# Patient Record
Sex: Male | Born: 1955 | Race: White | Hispanic: No | Marital: Married | Smoking: Former smoker
Health system: Southern US, Community
[De-identification: ages and names within clinical notes are randomized; demographics above are authoritative.]

## PROBLEM LIST (undated history)

## (undated) DIAGNOSIS — M549 Dorsalgia, unspecified: Secondary | ICD-10-CM

## (undated) DIAGNOSIS — E785 Hyperlipidemia, unspecified: Secondary | ICD-10-CM

## (undated) DIAGNOSIS — I255 Ischemic cardiomyopathy: Secondary | ICD-10-CM

## (undated) DIAGNOSIS — I10 Essential (primary) hypertension: Secondary | ICD-10-CM

## (undated) DIAGNOSIS — I2510 Atherosclerotic heart disease of native coronary artery without angina pectoris: Secondary | ICD-10-CM

## (undated) DIAGNOSIS — E669 Obesity, unspecified: Secondary | ICD-10-CM

## (undated) DIAGNOSIS — R55 Syncope and collapse: Secondary | ICD-10-CM

## (undated) DIAGNOSIS — K635 Polyp of colon: Secondary | ICD-10-CM

## (undated) DIAGNOSIS — K219 Gastro-esophageal reflux disease without esophagitis: Secondary | ICD-10-CM

## (undated) HISTORY — DX: Dorsalgia, unspecified: M54.9

## (undated) HISTORY — DX: Hyperlipidemia, unspecified: E78.5

## (undated) HISTORY — DX: Polyp of colon: K63.5

## (undated) HISTORY — DX: Ischemic cardiomyopathy: I25.5

## (undated) HISTORY — DX: Syncope and collapse: R55

## (undated) HISTORY — DX: Essential (primary) hypertension: I10

## (undated) HISTORY — DX: Gastro-esophageal reflux disease without esophagitis: K21.9

## (undated) HISTORY — DX: Obesity, unspecified: E66.9

## (undated) HISTORY — PX: CHOLECYSTECTOMY: SHX55

## (undated) HISTORY — DX: Atherosclerotic heart disease of native coronary artery without angina pectoris: I25.10

---

## 1997-11-01 ENCOUNTER — Observation Stay (HOSPITAL_COMMUNITY): Admission: RE | Admit: 1997-11-01 | Discharge: 1997-11-02 | Payer: Self-pay | Admitting: General Surgery

## 1998-04-05 ENCOUNTER — Encounter: Payer: Self-pay | Admitting: Emergency Medicine

## 1998-04-05 ENCOUNTER — Observation Stay (HOSPITAL_COMMUNITY): Admission: EM | Admit: 1998-04-05 | Discharge: 1998-04-06 | Payer: Self-pay | Admitting: Emergency Medicine

## 1998-04-13 ENCOUNTER — Emergency Department (HOSPITAL_COMMUNITY): Admission: EM | Admit: 1998-04-13 | Discharge: 1998-04-13 | Payer: Self-pay | Admitting: Emergency Medicine

## 1998-04-13 ENCOUNTER — Encounter: Payer: Self-pay | Admitting: Emergency Medicine

## 1998-07-11 ENCOUNTER — Ambulatory Visit (HOSPITAL_COMMUNITY): Admission: RE | Admit: 1998-07-11 | Discharge: 1998-07-11 | Payer: Self-pay | Admitting: Specialist

## 1998-07-11 ENCOUNTER — Encounter: Payer: Self-pay | Admitting: Specialist

## 1998-11-12 ENCOUNTER — Ambulatory Visit (HOSPITAL_COMMUNITY): Admission: RE | Admit: 1998-11-12 | Discharge: 1998-11-12 | Payer: Self-pay | Admitting: Specialist

## 1998-11-12 ENCOUNTER — Encounter: Payer: Self-pay | Admitting: Specialist

## 1999-03-03 DIAGNOSIS — I2510 Atherosclerotic heart disease of native coronary artery without angina pectoris: Secondary | ICD-10-CM

## 1999-03-03 HISTORY — PX: CORONARY ANGIOPLASTY WITH STENT PLACEMENT: SHX49

## 1999-03-03 HISTORY — DX: Atherosclerotic heart disease of native coronary artery without angina pectoris: I25.10

## 1999-10-04 ENCOUNTER — Encounter: Payer: Self-pay | Admitting: *Deleted

## 1999-10-04 ENCOUNTER — Observation Stay (HOSPITAL_COMMUNITY): Admission: EM | Admit: 1999-10-04 | Discharge: 1999-10-05 | Payer: Self-pay | Admitting: *Deleted

## 1999-10-29 ENCOUNTER — Ambulatory Visit (HOSPITAL_COMMUNITY): Admission: RE | Admit: 1999-10-29 | Discharge: 1999-10-30 | Payer: Self-pay | Admitting: *Deleted

## 2000-06-23 ENCOUNTER — Encounter: Payer: Self-pay | Admitting: Emergency Medicine

## 2000-06-24 ENCOUNTER — Observation Stay (HOSPITAL_COMMUNITY): Admission: EM | Admit: 2000-06-24 | Discharge: 2000-06-25 | Payer: Self-pay | Admitting: Emergency Medicine

## 2000-08-05 ENCOUNTER — Encounter: Admission: RE | Admit: 2000-08-05 | Discharge: 2000-08-05 | Payer: Self-pay | Admitting: Neurosurgery

## 2000-08-05 ENCOUNTER — Encounter: Payer: Self-pay | Admitting: Neurosurgery

## 2000-08-14 ENCOUNTER — Ambulatory Visit (HOSPITAL_COMMUNITY): Admission: RE | Admit: 2000-08-14 | Discharge: 2000-08-14 | Payer: Self-pay | Admitting: Neurosurgery

## 2000-08-14 ENCOUNTER — Encounter: Payer: Self-pay | Admitting: Neurosurgery

## 2000-11-15 ENCOUNTER — Emergency Department (HOSPITAL_COMMUNITY): Admission: EM | Admit: 2000-11-15 | Discharge: 2000-11-16 | Payer: Self-pay | Admitting: Emergency Medicine

## 2001-01-14 ENCOUNTER — Encounter: Payer: Self-pay | Admitting: Emergency Medicine

## 2001-01-14 ENCOUNTER — Inpatient Hospital Stay (HOSPITAL_COMMUNITY): Admission: EM | Admit: 2001-01-14 | Discharge: 2001-01-17 | Payer: Self-pay | Admitting: Emergency Medicine

## 2002-08-10 ENCOUNTER — Encounter: Payer: Self-pay | Admitting: Emergency Medicine

## 2002-08-11 ENCOUNTER — Observation Stay (HOSPITAL_COMMUNITY): Admission: EM | Admit: 2002-08-11 | Discharge: 2002-08-11 | Payer: Self-pay | Admitting: Emergency Medicine

## 2005-02-03 ENCOUNTER — Encounter: Admission: RE | Admit: 2005-02-03 | Discharge: 2005-02-03 | Payer: Self-pay | Admitting: Emergency Medicine

## 2005-02-12 ENCOUNTER — Ambulatory Visit: Payer: Self-pay | Admitting: Gastroenterology

## 2005-06-05 ENCOUNTER — Emergency Department (HOSPITAL_COMMUNITY): Admission: EM | Admit: 2005-06-05 | Discharge: 2005-06-05 | Payer: Self-pay | Admitting: Emergency Medicine

## 2006-01-11 ENCOUNTER — Ambulatory Visit (HOSPITAL_COMMUNITY): Admission: RE | Admit: 2006-01-11 | Discharge: 2006-01-11 | Payer: Self-pay | Admitting: Anesthesiology

## 2006-03-23 ENCOUNTER — Ambulatory Visit: Payer: Self-pay | Admitting: Gastroenterology

## 2006-04-10 ENCOUNTER — Emergency Department (HOSPITAL_COMMUNITY): Admission: EM | Admit: 2006-04-10 | Discharge: 2006-04-11 | Payer: Self-pay | Admitting: Emergency Medicine

## 2007-03-03 DIAGNOSIS — K635 Polyp of colon: Secondary | ICD-10-CM

## 2007-03-03 HISTORY — DX: Polyp of colon: K63.5

## 2007-11-01 HISTORY — PX: COLONOSCOPY: SHX174

## 2007-11-10 DIAGNOSIS — K219 Gastro-esophageal reflux disease without esophagitis: Secondary | ICD-10-CM

## 2007-11-10 DIAGNOSIS — I10 Essential (primary) hypertension: Secondary | ICD-10-CM | POA: Insufficient documentation

## 2007-11-11 ENCOUNTER — Ambulatory Visit: Payer: Self-pay | Admitting: Gastroenterology

## 2007-11-11 DIAGNOSIS — I2510 Atherosclerotic heart disease of native coronary artery without angina pectoris: Secondary | ICD-10-CM

## 2007-11-11 LAB — CONVERTED CEMR LAB
ALT: 24 units/L (ref 0–53)
AST: 19 units/L (ref 0–37)
Albumin: 4.4 g/dL (ref 3.5–5.2)
Alkaline Phosphatase: 100 units/L (ref 39–117)
BUN: 13 mg/dL (ref 6–23)
Basophils Absolute: 0.1 10*3/uL (ref 0.0–0.1)
Basophils Relative: 1.4 % (ref 0.0–3.0)
Bilirubin, Direct: 0.1 mg/dL (ref 0.0–0.3)
CO2: 29 meq/L (ref 19–32)
Calcium: 9.3 mg/dL (ref 8.4–10.5)
Chloride: 99 meq/L (ref 96–112)
Creatinine, Ser: 1 mg/dL (ref 0.4–1.5)
Eosinophils Absolute: 0.4 10*3/uL (ref 0.0–0.7)
Eosinophils Relative: 4.8 % (ref 0.0–5.0)
Ferritin: 126.1 ng/mL (ref 22.0–322.0)
Folate: 6.1 ng/mL
GFR calc Af Amer: 100 mL/min
GFR calc non Af Amer: 83 mL/min
Glucose, Bld: 96 mg/dL (ref 70–99)
HCT: 43.8 % (ref 39.0–52.0)
Hemoglobin: 15.2 g/dL (ref 13.0–17.0)
Iron: 81 ug/dL (ref 42–165)
Lymphocytes Relative: 37.8 % (ref 12.0–46.0)
MCHC: 34.6 g/dL (ref 30.0–36.0)
MCV: 86.8 fL (ref 78.0–100.0)
Monocytes Absolute: 0.7 10*3/uL (ref 0.1–1.0)
Monocytes Relative: 8.9 % (ref 3.0–12.0)
Neutro Abs: 3.6 10*3/uL (ref 1.4–7.7)
Neutrophils Relative %: 47.1 % (ref 43.0–77.0)
Platelets: 268 10*3/uL (ref 150–400)
Potassium: 3.6 meq/L (ref 3.5–5.1)
RBC: 5.05 M/uL (ref 4.22–5.81)
RDW: 12.6 % (ref 11.5–14.6)
Saturation Ratios: 24.6 % (ref 20.0–50.0)
Sodium: 140 meq/L (ref 135–145)
TSH: 3.67 microintl units/mL (ref 0.35–5.50)
Total Bilirubin: 1 mg/dL (ref 0.3–1.2)
Total Protein: 7.7 g/dL (ref 6.0–8.3)
Transferrin: 235.2 mg/dL (ref >=212.0)
Vitamin B-12: 322 pg/mL (ref 211–911)
WBC: 7.7 10*3/uL (ref 4.5–10.5)

## 2007-11-16 ENCOUNTER — Encounter: Payer: Self-pay | Admitting: Gastroenterology

## 2007-11-16 ENCOUNTER — Ambulatory Visit: Payer: Self-pay | Admitting: Gastroenterology

## 2007-11-21 ENCOUNTER — Encounter: Payer: Self-pay | Admitting: Gastroenterology

## 2008-03-02 HISTORY — PX: CARDIOVASCULAR STRESS TEST: SHX262

## 2008-05-11 ENCOUNTER — Encounter: Admission: RE | Admit: 2008-05-11 | Discharge: 2008-05-11 | Payer: Self-pay | Admitting: Family Medicine

## 2008-10-09 ENCOUNTER — Emergency Department (HOSPITAL_COMMUNITY): Admission: EM | Admit: 2008-10-09 | Discharge: 2008-10-09 | Payer: Self-pay | Admitting: Family Medicine

## 2008-10-22 ENCOUNTER — Encounter (INDEPENDENT_AMBULATORY_CARE_PROVIDER_SITE_OTHER): Payer: Self-pay | Admitting: *Deleted

## 2009-03-13 ENCOUNTER — Telehealth: Payer: Self-pay | Admitting: Gastroenterology

## 2009-04-18 ENCOUNTER — Ambulatory Visit: Payer: Self-pay | Admitting: Gastroenterology

## 2009-04-18 DIAGNOSIS — K5909 Other constipation: Secondary | ICD-10-CM

## 2009-04-22 DIAGNOSIS — E538 Deficiency of other specified B group vitamins: Secondary | ICD-10-CM | POA: Insufficient documentation

## 2009-04-22 LAB — CONVERTED CEMR LAB
ALT: 29 units/L (ref 0–53)
AST: 24 units/L (ref 0–37)
BUN: 12 mg/dL (ref 6–23)
Basophils Absolute: 0 10*3/uL (ref 0.0–0.1)
Basophils Relative: 0 % (ref 0.0–3.0)
Bilirubin, Direct: 0.2 mg/dL (ref 0.0–0.3)
CO2: 30 meq/L (ref 19–32)
Chloride: 104 meq/L (ref 96–112)
Eosinophils Absolute: 0.4 10*3/uL (ref 0.0–0.7)
HCT: 43 % (ref 39.0–52.0)
Iron: 94 ug/dL (ref 42–165)
Lymphocytes Relative: 20.9 % (ref 12.0–46.0)
MCV: 89.6 fL (ref 78.0–100.0)
Monocytes Relative: 7.6 % (ref 3.0–12.0)
Neutro Abs: 4.3 10*3/uL (ref 1.4–7.7)
Platelets: 208 10*3/uL (ref 150.0–400.0)
Potassium: 4.3 meq/L (ref 3.5–5.1)
RDW: 11.9 % (ref 11.5–14.6)
Saturation Ratios: 31.9 % (ref 20.0–50.0)
Total Bilirubin: 1.2 mg/dL (ref 0.3–1.2)
Total Protein: 7.3 g/dL (ref 6.0–8.3)
Transferrin: 210.5 mg/dL — ABNORMAL LOW (ref 212.0–360.0)
WBC: 6.6 10*3/uL (ref 4.5–10.5)

## 2009-04-23 ENCOUNTER — Ambulatory Visit: Payer: Self-pay | Admitting: Gastroenterology

## 2009-05-01 ENCOUNTER — Ambulatory Visit: Payer: Self-pay | Admitting: Gastroenterology

## 2009-05-09 ENCOUNTER — Ambulatory Visit: Payer: Self-pay | Admitting: Gastroenterology

## 2009-05-14 ENCOUNTER — Telehealth: Payer: Self-pay | Admitting: Gastroenterology

## 2009-06-24 ENCOUNTER — Ambulatory Visit: Payer: Self-pay | Admitting: Gastroenterology

## 2009-09-06 ENCOUNTER — Ambulatory Visit: Payer: Self-pay | Admitting: Gastroenterology

## 2009-10-04 ENCOUNTER — Ambulatory Visit: Payer: Self-pay | Admitting: Gastroenterology

## 2009-11-05 ENCOUNTER — Ambulatory Visit: Payer: Self-pay | Admitting: Gastroenterology

## 2009-12-03 ENCOUNTER — Ambulatory Visit: Payer: Self-pay | Admitting: Gastroenterology

## 2010-01-03 ENCOUNTER — Ambulatory Visit: Payer: Self-pay | Admitting: Gastroenterology

## 2010-02-03 ENCOUNTER — Ambulatory Visit: Payer: Self-pay | Admitting: Gastroenterology

## 2010-03-06 ENCOUNTER — Ambulatory Visit
Admission: RE | Admit: 2010-03-06 | Discharge: 2010-03-06 | Payer: Self-pay | Source: Home / Self Care | Attending: Gastroenterology | Admitting: Gastroenterology

## 2010-04-01 NOTE — Assessment & Plan Note (Signed)
Summary: monthly B12...as.  Nurse Visit  Medication Administration  Injection # 1:    Medication: Vit B12 1000 mcg    Diagnosis: B12 DEFICIENCY (ICD-266.2)    Route: IM    Site: R deltoid    Exp Date: 04/2011    Lot #: [id number]    Mfr: [employer]    Comments: PT WILL RETURN ON 07/22/09 FOR NEXT INJECTION    Patient tolerated injection without complications    Given by: Francee Piccolo CMA Duncan Dull) (June 24, 2009 11:31 AM)  Orders Added: 1)  Vit B12 1000 mcg [J3420]

## 2010-04-01 NOTE — Assessment & Plan Note (Signed)
Summary: MONTHLY B12 SHOT  Nurse Visit   Allergies: No Known Drug Allergies  Medication Administration  Injection # 1:    Medication: Vit B12 1000 mcg    Diagnosis: B12 DEFICIENCY (ICD-266.2)    Route: IM    Site: L deltoid    Exp Date: 10/2011    Lot #: [id number]    Mfr: [employer]    Patient tolerated injection without complications    Given by: Christie Nottingham CMA (AAMA) (January 03, 2010 10:39 AM)  Orders Added: 1)  Vit B12 1000 mcg [J3420]

## 2010-04-01 NOTE — Assessment & Plan Note (Signed)
Summary: Monthly B12, 266.2  Nurse Visit   Allergies: No Known Drug Allergies  Medication Administration  Injection # 1:    Medication: Vit B12 1000 mcg    Diagnosis: B12 DEFICIENCY (ICD-266.2)    Route: IM    Site: L deltoid    Exp Date: 4/13    Lot #: [id number]    Mfr: [employer]    Patient tolerated injection without complications    Given by: Lamona Curl CMA (AAMA) (October 04, 2009 9:38 AM)  Orders Added: 1)  Vit B12 1000 mcg [J3420]

## 2010-04-01 NOTE — Assessment & Plan Note (Signed)
Summary: MONTHLY B12 INJECTION//SP  Nurse Visit   Allergies: No Known Drug Allergies  Medication Administration  Injection # 1:    Medication: Vit B12 1000 mcg    Diagnosis: B12 DEFICIENCY (ICD-266.2)    Route: IM    Site: R deltoid    Exp Date: 072013    Lot #: [id number]    Mfr: [employer]    Patient tolerated injection without complications    Given by: Merri Ray CMA (AAMA) (December 03, 2009 10:19 AM)  Orders Added: 1)  Vit B12 1000 mcg [J3420]

## 2010-04-01 NOTE — Assessment & Plan Note (Signed)
Summary: b12 shot...em  Nurse Visit   Allergies: No Known Drug Allergies  Medication Administration  Injection # 1:    Medication: Vit B12 1000 mcg    Diagnosis: B12 DEFICIENCY (ICD-266.2)    Route: IM    Site: L deltoid    Exp Date: 06/01/2011    Lot #: [id number]    Mfr: [employer]    Comments: Appointment made for next monthly B12 injection on  10-04-09 at 9:30 AM. Appt card provided for pt. I urged pt to come monthly for his B12 injection.  He missed May and June.     Patient tolerated injection without complications    Given by: Lowry Ram NCMA (September 06, 2009 9:17 AM)  Orders Added: 1)  Vit B12 1000 mcg [J3420]

## 2010-04-01 NOTE — Assessment & Plan Note (Signed)
Summary: B12 SHOT..AM.  Nurse Visit  Medication Administration  Injection # 1:    Medication: Vit B12 1000 mcg    Diagnosis: B12 DEFICIENCY (ICD-266.2)    Route: IM    Site: R deltoid    Exp Date: 01/2011    Lot #: [id number]    Mfr: [employer]    Comments: Pt will return on 05/08/09 for next injection.    Patient tolerated injection without complications    Given by: Francee Piccolo CMA Duncan Dull) (May 01, 2009 2:02 PM)  Orders Added: 1)  Vit B12 1000 mcg [J3420]

## 2010-04-01 NOTE — Progress Notes (Signed)
Summary: Schedule Colonoscopy  Phone Note Outgoing Call Call back at Home Phone [phone]   Call placed by: Harlow Mares CMA Duncan Dull),  March 13, 2009 8:46 AM Call placed to: Patient Summary of Call: left message with patients wife, he was sleeping.  Initial call taken by: Harlow Mares CMA Duncan Dull),  March 13, 2009 8:46 AM  Follow-up for Phone Call        patient needs a repeat colonoscopy at the hospital with Dr. Christella Hartigan with [facility]. per Dr. Maris Berger note on patients last pathology report in 2009. Patient and his wife advised of this and they state that they will call back and schedule patient is out of work now and they dont know about when they can call back. Follow-up by: Harlow Mares CMA Duncan Dull),  March 25, 2009 3:14 PM

## 2010-04-01 NOTE — Assessment & Plan Note (Signed)
Summary: medication refill--ch.   History of Present Illness Visit Type: Follow-up Visit Primary GI MD: Sheryn Bison MD Faith Rogue Primary Provider: Lovie Macadamia Chief Complaint: Loss of appetite & constipation for 1 year History of Present Illness:   Increasing constipation with abdominal bloating and a 55 year old Caucasian male who is on chronic narcotic therapy for low back pain. He had colonoscopy in 2009 with removal of benign polyp. He is asymptomatic except for gas and bloating and constipation despite p.r.n. MiraLax use. He denies melena, hematochezia, nausea vomiting, other upper GI with hepatobiliary complaints.   GI Review of Systems    Reports loss of appetite.      Denies abdominal pain, acid reflux, belching, bloating, chest pain, dysphagia with liquids, dysphagia with solids, heartburn, nausea, vomiting, vomiting blood, weight loss, and  weight gain.      Reports constipation.     Denies anal fissure, black tarry stools, change in bowel habit, diarrhea, diverticulosis, fecal incontinence, heme positive stool, hemorrhoids, irritable bowel syndrome, jaundice, light color stool, liver problems, rectal bleeding, and  rectal pain.    Current Medications (verified): 1)  Imipramine Hcl 50 Mg Tabs (Imipramine Hcl) .... One By Mouth Two Times A Day 2)  Zetia 10 Mg Tabs (Ezetimibe) .... One By Mouth Once Daily 3)  Metoprolol Succinate 50 Mg Xr24h-Tab (Metoprolol Succinate) .... 2 Tablets By Mouth Once Daily 4)  Oxycodone Hcl 15 Mg Tabs (Oxycodone Hcl) .... Take 1 Tablet Every 6 Hours As Needed For Pain  Allergies (verified): No Known Drug Allergies  Past History:  Past medical, surgical, family and social histories (including risk factors) reviewed for relevance to current acute and chronic problems.  Past Medical History: Reviewed history from 11/11/2007 and no changes required. 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 Stents placement-1999 Cholecystectomy  Family History: Reviewed history from 11/11/2007 and no changes required. No FH of Colon Cancer Family History of Heart Disease: Father  Social History: Reviewed history from 11/11/2007 and no changes required. Alcohol Use - no Patient is a former smoker.  Daily Caffeine Use Illicit Drug Use - no  Review of Systems       The patient complains of back pain and heart rhythm changes.  The patient denies allergy/sinus, anemia, anxiety-new, arthritis/joint pain, blood in urine, breast changes/lumps, change in vision, confusion, cough, coughing up blood, depression-new, fainting, fatigue, fever, headaches-new, hearing problems, heart murmur, itching, menstrual pain, muscle pains/cramps, night sweats, nosebleeds, pregnancy symptoms, shortness of breath, skin rash, sleeping problems, sore throat, swelling of feet/legs, swollen lymph glands, thirst - excessive , urination - excessive , urination changes/pain, urine leakage, vision changes, and voice change.    Vital Signs:  Patient profile:   55 year old male Height:      69 inches Weight:      221.38 pounds BMI:     32.81 Pulse rate:   92 / minute Pulse rhythm:   regular BP sitting:   128 / 80  (left arm) Cuff size:   regular  Vitals Entered By: June McMurray CMA Duncan Dull) (April 18, 2009 11:05 AM)  Physical Exam  General:  Well developed, well nourished, no acute distress.obese.  obese.   Head:  Normocephalic and atraumatic. Eyes:  PERRLA, no icterus.exam deferred to patient's ophthalmologist.   Abdomen:  Soft, nontender and nondistended. No masses, hepatosplenomegaly or hernias noted. Normal bowel sounds. Neurologic:  Alert and  oriented x4;  grossly normal neurologically. Psych:  Alert and cooperative. Normal mood and affect.   Impression & Recommendations:  Problem # 1:  CONSTIPATION  (ICD-564.00) Assessment Deteriorated Narcotic induced constipation---8 ounces of MiraLax at bedtime with trial of Amitiza 24 micrograms twice a day.Call in 2 weeks for progress report. Continue fiber diet ad lib. p.o. fluids.  Problem # 2:  GERD (ICD-530.81) Assessment: Improved He denies reflux symptoms at this time and is not on treatment.  Other Orders: TLB-CBC Platelet - w/Differential (85025-CBCD) TLB-BMP (Basic Metabolic Panel-BMET) (80048-METABOL) TLB-Hepatic/Liver Function Pnl (80076-HEPATIC) TLB-TSH (Thyroid Stimulating Hormone) (84443-TSH) TLB-B12, Serum-Total ONLY (57846-N62) TLB-Ferritin (82728-FER) TLB-Folic Acid (Folate) (82746-FOL) TLB-IBC Pnl (Iron/FE;Transferrin) (83550-IBC)  Patient Instructions: 1)  Diet should be high in fiber ( fruits, vegetables, whole grains) but low in residue. Drink at least eight (8) glasses of water a day.  2)  Constipation and Hemorrhoids brochure given.  3)  Labs pending 4)  MiraLax 8 ounces at bedtime and try Amitiza 24 micrograms twice a day with meals 5)  Please call our GI Office back in 2 weeks with a follow-up of symptoms. 6)  Daily Benefiber and increased p.o. fluids 7)  Aciphex 20 mg once daily for indigestion. 8)  The medication list was reviewed and reconciled.  All changed / newly prescribed medications were explained.  A complete medication list was provided to the patient / caregiver. Prescriptions: ACIPHEX 20 MG  TBEC (RABEPRAZOLE SODIUM) Take 1 each day 30 minutes before meals  #30 x 6   Entered by:   Ashok Cordia RN   Authorized by:   Mardella Layman MD [facility]   Signed by:   Ashok Cordia RN on 04/18/2009   Method used:   [facility]        [facility]  [address] (retail)       [address]       [place], [place]  [address]       Ph: [phone] or [phone]       Fax: [phone]   RxID:   [id number] AMITIZA 24 MCG  CAPS (LUBIPROSTONE) 1 two times a day/take with food and water  #60 x 6    Entered by:   Ashok Cordia RN   Authorized by:   Mardella Layman MD [facility]   Signed by:   Ashok Cordia RN on 04/18/2009   Method used:   [facility]        [facility]  [address] (retail)       [address]       [place], [place]  [address]       Ph: [phone] or [phone]       Fax: [phone]   RxID:   [id number]

## 2010-04-01 NOTE — Assessment & Plan Note (Signed)
Summary: monthly b12...as.  Nurse Visit   Allergies: No Known Drug Allergies  Medication Administration  Injection # 1:    Medication: Vit B12 1000 mcg    Diagnosis: B12 DEFICIENCY (ICD-266.2)    Route: IM    Site: L deltoid    Exp Date: 12/2011    Lot #: [id number]    Mfr: [employer]    Comments: pt to schedule next monthly b 12 at front desk     Patient tolerated injection without complications    Given by: Chales Abrahams CMA Duncan Dull) (February 03, 2010 10:07 AM)  Orders Added: 1)  Vit B12 1000 mcg [J3420]

## 2010-04-01 NOTE — Assessment & Plan Note (Signed)
Summary: #1 of 3 weekly B12/dfs  Nurse Visit   Allergies: No Known Drug Allergies  Medication Administration  Injection # 1:    Medication: Vit B12 1000 mcg    Diagnosis: B12 DEFICIENCY (ICD-266.2)    Route: IM    Site: L deltoid    Exp Date: 11/12    Lot #: [id number]    Mfr: [employer]    Patient tolerated injection without complications    Given by: Hortense Ramal CMA Duncan Dull) (April 23, 2009 1:55 PM)  Orders Added: 1)  Vit B12 1000 mcg [J3420]

## 2010-04-01 NOTE — Assessment & Plan Note (Signed)
Summary: WEEKLY B12 #3 OF 3/266.2/SP  Nurse Visit   Allergies: No Known Drug Allergies  Medication Administration  Injection # 1:    Medication: Vit B12 1000 mcg    Diagnosis: B12 DEFICIENCY (ICD-266.2)    Route: IM    Site: L deltoid    Exp Date: 8/12    Lot #: [id number]    Mfr: [employer]    Comments: patient has decided to do nasal b12 instead of monthly injections.  Rx. sent to pharmacy and discount card/info given to patient.    Patient tolerated injection without complications    Given by: Milford Cage NCMA (May 09, 2009 1:25 PM)  Orders Added: 1)  Vit B12 1000 mcg [J3420]  Appended Document: WEEKLY B12 #3 OF 3/266.2/SP Pt to begin nascobal.   Clinical Lists Changes  Medications: Removed medication of CYANOCOBALAMIN 1000 MCG/ML INJ SOLN (CYANOCOBALAMIN) 1 cc IM weekly X 3 then monthly Added new medication of NASCOBAL 500 MCG/0.1ML SOLN (CYANOCOBALAMIN) 1 spray weekly - Signed Rx of NASCOBAL 500 MCG/0.1ML SOLN (CYANOCOBALAMIN) 1 spray weekly;  #1 x 6;  Signed;  Entered by: Ashok Cordia RN;  Authorized by: Mardella Layman MD [facility];  Method used: [facility]  [address] [place], [place]  [address], Ph: [phone] or [phone], Fax: [phone]    Prescriptions: NASCOBAL 500 MCG/0.1ML SOLN (CYANOCOBALAMIN) 1 spray weekly  #1 x 6   Entered by:   Ashok Cordia RN   Authorized by:   Mardella Layman MD [facility]   Signed by:   Ashok Cordia RN on 05/09/2009   Method used:   [facility]        [facility]  [address] (retail)       [address]       [place], [place]  [address]       Ph: [phone] or [phone]       Fax: [phone]   RxID:   [id number]

## 2010-04-01 NOTE — Progress Notes (Signed)
Summary: B12 injections questions  Phone Note Call from Patient Call back at Home Phone [phone]   Caller: Sherlene Shams Call For: Dr Jarold Motto Reason for Call: Talk to Nurse Summary of Call: Questions about B12 injections. Initial call taken by: Leanor Kail [facility],  May 14, 2009 2:44 PM  Follow-up for Phone Call        Pt does not want to take the nasal spray due to the cost.  Pt has not tried to use the discount card given.  Will check on this and call back if want to sch injection in one month.  Follow-up by: Ashok Cordia RN,  May 14, 2009 3:10 PM

## 2010-04-01 NOTE — Assessment & Plan Note (Signed)
Summary: B12 SHOT  Nurse Visit   Medication Administration  Injection # 1:    Medication: Vit B12 1000 mcg    Diagnosis: B12 DEFICIENCY (ICD-266.2)    Route: IM    Site: R deltoid    Exp Date: 08/2011    Lot #: [id number]    Mfr: [employer]    Comments: Pt will return on 10/4 for next injection.    Patient tolerated injection without complications    Given by: Francee Piccolo CMA Duncan Dull) (November 05, 2009 10:27 AM)  Orders Added: 1)  Vit B12 1000 mcg [J3420]

## 2010-04-03 ENCOUNTER — Ambulatory Visit: Admit: 2010-04-03 | Payer: Self-pay | Admitting: Gastroenterology

## 2010-04-03 NOTE — Assessment & Plan Note (Signed)
Summary: MONTHLY B12 SHOT...LSW.  Nurse Visit   Allergies: No Known Drug Allergies  Medication Administration  Injection # 1:    Medication: Vit B12 1000 mcg    Diagnosis: B12 DEFICIENCY (ICD-266.2)    Route: IM    Site: R deltoid    Exp Date: 12/2011    Lot #: [id number]    Mfr: [employer]    Comments: pt to schedule next monthly b12 at front desk    Patient tolerated injection without complications    Given by: Chales Abrahams CMA (AAMA) (March 06, 2010 9:55 AM)  Orders Added: 1)  Vit B12 1000 mcg [J3420]

## 2010-04-04 ENCOUNTER — Encounter: Payer: Self-pay | Admitting: Gastroenterology

## 2010-04-04 ENCOUNTER — Encounter (INDEPENDENT_AMBULATORY_CARE_PROVIDER_SITE_OTHER): Payer: Medicare Other

## 2010-04-04 DIAGNOSIS — E538 Deficiency of other specified B group vitamins: Secondary | ICD-10-CM

## 2010-04-09 NOTE — Assessment & Plan Note (Signed)
Summary: Monthly B12 Injection  Nurse Visit   Allergies: No Known Drug Allergies  Medication Administration  Injection # 1:    Medication: Vit B12 1000 mcg    Diagnosis: B12 DEFICIENCY (ICD-266.2)    Route: IM    Site: L deltoid    Exp Date: 01/01/2012    Lot #: [id number]    Mfr: [employer]    Comments: Monthly B12 injenction    Patient tolerated injection without complications    Given by: June McMurray CMA Duncan Dull) (April 04, 2010 11:29 AM)  Orders Added: 1)  Vit B12 1000 mcg [J3420]   Medication Administration  Injection # 1:    Medication: Vit B12 1000 mcg    Diagnosis: B12 DEFICIENCY (ICD-266.2)    Route: IM    Site: L deltoid    Exp Date: 01/01/2012    Lot #: [id number]    Mfr: [employer]    Comments: Monthly B12 injenction    Patient tolerated injection without complications    Given by: June McMurray CMA Duncan Dull) (April 04, 2010 11:29 AM)  Orders Added: 1)  Vit B12 1000 mcg [J3420]

## 2010-05-09 ENCOUNTER — Encounter: Payer: Self-pay | Admitting: Gastroenterology

## 2010-05-09 ENCOUNTER — Encounter (INDEPENDENT_AMBULATORY_CARE_PROVIDER_SITE_OTHER): Payer: Medicare Other

## 2010-05-09 DIAGNOSIS — E538 Deficiency of other specified B group vitamins: Secondary | ICD-10-CM

## 2010-05-13 NOTE — Assessment & Plan Note (Signed)
Summary: MONTHLY B12 SHOT...LSW  Nurse Visit   Allergies: No Known Drug Allergies  Medication Administration  Injection # 1:    Medication: Vit B12 1000 mcg    Diagnosis: B12 DEFICIENCY (ICD-266.2)    Route: IM    Site: R deltoid    Exp Date: 01/2012    Lot #: [id number]    Mfr: [employer]    Patient tolerated injection without complications    Given by: Harlow Mares CMA (AAMA) (May 09, 2010 2:16 PM)  Orders Added: 1)  Vit B12 1000 mcg [J3420]

## 2010-05-17 ENCOUNTER — Inpatient Hospital Stay (HOSPITAL_COMMUNITY)
Admission: EM | Admit: 2010-05-17 | Discharge: 2010-05-19 | DRG: 303 | Disposition: A | Discharge status: Home / Self Care | Payer: Medicare Other | Attending: Cardiology | Admitting: Cardiology

## 2010-05-17 ENCOUNTER — Emergency Department (HOSPITAL_COMMUNITY): Payer: Medicare Other

## 2010-05-17 DIAGNOSIS — R0602 Shortness of breath: Secondary | ICD-10-CM

## 2010-05-17 DIAGNOSIS — I10 Essential (primary) hypertension: Secondary | ICD-10-CM | POA: Diagnosis present

## 2010-05-17 DIAGNOSIS — R5381 Other malaise: Secondary | ICD-10-CM | POA: Diagnosis present

## 2010-05-17 DIAGNOSIS — E785 Hyperlipidemia, unspecified: Secondary | ICD-10-CM | POA: Diagnosis present

## 2010-05-17 DIAGNOSIS — R0789 Other chest pain: Secondary | ICD-10-CM

## 2010-05-17 DIAGNOSIS — I2510 Atherosclerotic heart disease of native coronary artery without angina pectoris: Principal | ICD-10-CM | POA: Diagnosis present

## 2010-05-17 LAB — COMPREHENSIVE METABOLIC PANEL
Albumin: 4.1 g/dL (ref 3.5–5.2)
BUN: 19 mg/dL (ref 6–23)
Calcium: 9.2 mg/dL (ref 8.4–10.5)
Chloride: 102 mEq/L (ref 96–112)
Creatinine, Ser: 1.31 mg/dL (ref 0.4–1.5)
Total Bilirubin: 0.9 mg/dL (ref 0.3–1.2)
Total Protein: 7.2 g/dL (ref 6.0–8.3)

## 2010-05-17 LAB — CBC
MCH: 29.7 pg (ref 26.0–34.0)
MCHC: 34.6 g/dL (ref 30.0–36.0)
MCV: 86 fL (ref 78.0–100.0)
Platelets: 264 10*3/uL (ref 150–400)
RDW: 12.7 % (ref 11.5–15.5)

## 2010-05-17 LAB — POCT CARDIAC MARKERS
CKMB, poc: 1 ng/mL (ref 1.0–8.0)
Myoglobin, poc: 98.4 ng/mL (ref 12–200)

## 2010-05-18 LAB — HEPARIN LEVEL (UNFRACTIONATED)
Heparin Unfractionated: 0.66 IU/mL (ref 0.30–0.70)
Heparin Unfractionated: 0.33 IU/mL (ref 0.30–0.70)

## 2010-05-18 LAB — TROPONIN I: Troponin I: 0.01 ng/mL (ref 0.00–0.06)

## 2010-05-18 LAB — CBC
MCH: 29.7 pg (ref 26.0–34.0)
Platelets: 244 10*3/uL (ref 150–400)
RBC: 4.78 MIL/uL (ref 4.22–5.81)

## 2010-05-18 LAB — CARDIAC PANEL(CRET KIN+CKTOT+MB+TROPI)
CK, MB: 1.4 ng/mL (ref 0.3–4.0)
CK, MB: 1.4 ng/mL (ref 0.3–4.0)
Relative Index: INVALID (ref 0.0–2.5)
Troponin I: 0.01 ng/mL (ref 0.00–0.06)

## 2010-05-18 LAB — LIPID PANEL: Triglycerides: 135 mg/dL (ref <150)

## 2010-05-18 LAB — CK TOTAL AND CKMB (NOT AT ARMC)
CK, MB: 1.4 ng/mL (ref 0.3–4.0)
Total CK: 88 U/L (ref 7–232)

## 2010-05-18 LAB — D-DIMER, QUANTITATIVE: D-Dimer, Quant: 0.38 ug/mL-FEU (ref 0.00–0.48)

## 2010-05-18 NOTE — H&P (Signed)
NAME:  Mark [name], Mark [name] NO.:  [id number]  MEDICAL RECORD NO.:  [id number]           PATIENT TYPE:  E  LOCATION:  [facility]                         FACILITY:  [facility]  PHYSICIAN:  Wendi Snipes, MD DATE OF BIRTH:  18-Jan-1956  DATE OF ADMISSION:  05/17/2010 DATE OF DISCHARGE:                             HISTORY & PHYSICAL   PRIMARY CARDIOLOGIST:  Georga Hacking, MD  PRIMARY DOCTOR:  Oley Balm. Georgina Pillion, MD  CHIEF COMPLAINT:  Shortness of breath and chest pressure.  HISTORY OF PRESENT ILLNESS:  This is a 55 year old white male with a history of bare-mental stenting to the proximal LAD in 2001 who presents with sudden onset of shortness of breath followed by chest pressure.  He states that he has been in his usual state of health prior to this and his symptoms occurred at rest and were not preceded by any additional symptoms.  He, otherwise, denies recent exertional angina, dyspnea on exertion, increased lower extremity edema, paroxysmal nocturnal dyspnea, orthopnea, or palpitations.  However, he does describe an exhaust feeling over the past few months.  He has been compliant with his medications and has otherwise been healthy.  PAST MEDICAL HISTORY: 1. Hyperlipidemia. 2. Coronary artery disease, status post PCI in 2001 with a bare-mental     stenting to the proximal LAD. 3. Hypertension. 4. Low back pain.  ALLERGIES:  No known drug allergies.  MEDICATIONS ON ADMISSION: 1. Metoprolol 50 mg twice daily. 2. Oxycodone 15 mg every 4 hours. 3. Aspirin.  SOCIAL HISTORY:  He lives in [place] with his wife.  He is a Data processing manager.  He quit tobacco in 2002.  FAMILY HISTORY:  His father had myocardial infarction in his 39s.  REVIEW OF SYSTEMS:  All 14 systems were reviewed and were negative except as mentioned detail in the HPI.  PHYSICAL EXAMINATION:  VITAL SIGNS:  Blood pressure is 109/64, respiratory rate is 20, pulses 69, and saturating 96% on  room air. GENERAL:  He is a 55 year old white male appearing his stated age, in no acute distress. HEENT:  Moist mucous membranes.  Pupils are equal, round, and reactive to light and accommodation.  Anicteric sclera. NECK:  No jugular venous distention or thyromegaly. CARDIOVASCULAR:  Regular rate and rhythm.  No murmurs, rubs, or gallops. LUNGS:  Clear to auscultation bilaterally. ABDOMEN:  Nontender and nondistended.  Positive bowel sounds.  No masses. EXTREMITIES:  No clubbing, cyanosis, or edema. NEUROLOGIC:  Alert and oriented x3.  Cranial nerves II-XII grossly intact.  No focal neurologic deficit. PSYCH:  Mood and affect are appropriate. SKIN:  Warm, dry, and intact.  No rashes.  RADIOLOGY:  Chest x-ray is currently pending.  EKG shows normal sinus rhythm with a rate of 71 beats per minute with no ST-T wave abnormalities.  LABORATORY DATA:  White cell count is 11 and hematocrit is 40. Potassium is 4.2 and creatinine is 1.3.  Troponin is less than 0.05.  ASSESSMENT/PLAN:  This is a 55 year old white male with a history of percutaneous coronary intervention, here with shortness of breath and chest pain concerning for unstable angina. 1. Unstable angina:  He  has no current objective evidence of ischemia.     We will start empiric heparin drip and aspirin.  Continue his beta-     blocker and begin statin medication.  We will continue to cycle     cardiac enzymes and consider noninvasive risk ratification if he     rules out for myocardial infarction. 2. Hypertension:  His blood pressure is currently at goal.  We will     consider low-dose ACE inhibitor. 3. Hyperlipidemia:  We will start statin and check fasting lipid     profile.     Wendi Snipes, MD     [facility]MED[facility]  D:  05/18/2010  T:  05/18/2010  Job:  [id number]  Electronically Signed by Jim Desanctis MD on 05/18/2010 03:25:13 PM

## 2010-05-19 ENCOUNTER — Inpatient Hospital Stay (HOSPITAL_COMMUNITY): Payer: Medicare Other

## 2010-05-19 LAB — CBC
HCT: 42.6 % (ref 39.0–52.0)
Hemoglobin: 14.6 g/dL (ref 13.0–17.0)
WBC: 8.4 10*3/uL (ref 4.0–10.5)

## 2010-05-19 MED ORDER — TECHNETIUM TC 99M TETROFOSMIN IV KIT
10.0000 | PACK | Freq: Once | INTRAVENOUS | Status: AC | PRN
  Administered 2010-05-19: 10 via INTRAVENOUS

## 2010-05-19 MED ORDER — TECHNETIUM TC 99M TETROFOSMIN IV KIT
30.0000 | PACK | Freq: Once | INTRAVENOUS | Status: AC | PRN
  Administered 2010-05-19: 30 via INTRAVENOUS

## 2010-05-20 ENCOUNTER — Other Ambulatory Visit (HOSPITAL_COMMUNITY): Payer: Medicare Other

## 2010-05-26 NOTE — Discharge Summary (Signed)
  NAME:  Mark [name], Mark [name] NO.:  [id number]  MEDICAL RECORD NO.:  [id number]           PATIENT TYPE:  I  LOCATION:  2009                         FACILITY:  [facility]  PHYSICIAN:  Corky Crafts, MDDATE OF BIRTH:  19-Feb-1956  DATE OF ADMISSION:  05/17/2010 DATE OF DISCHARGE:  05/19/2010                              DISCHARGE SUMMARY   FINAL DIAGNOSES: 1. Coronary artery disease. 2. Hyperlipidemia. 3. Fatigue.  HISTORY OF PRESENT ILLNESS:  The patient is a 55 year old man who had a bare-metal stent to the LAD in 2001.  He had been having fatigue and some shortness of breath along with sweating.  He is concerned about coronary artery disease.  He did not have chest pain.  He ruled out for MI.  He underwent exercise stress testing.  He walked about 5 minutes 45 seconds on a Bruce protocol and had no ECG changes.  He did not experience any chest pain.  Myocardial perfusion images did not show any evidence of ischemia.  His ejection fraction was 50%.  He felt well walking the halls and was deemed ready for discharge.  DISCHARGE MEDICATIONS: 1. Tylenol p.r.n. 2. Crestor 10 mg daily. 3. Sublingual nitroglycerin p.r.n. 4. Aspirin 81 mg daily. 5. Metoprolol 75 mg b.i.d. 6. Oxycodone 15 mg q.4 h.  This is what he was taking before coming     into the hospital.  Follow up with Dr. Eldridge Dace in the next month     or so.  DIET:  Low-sodium, heart-healthy diet.  Activity as tolerated.  Lab work in the hospital, total cholesterol 206, triglycerides 135, HDL 22, LDL 157.     Corky Crafts, MD     [facility]/MEDQ  D:  05/19/2010  T:  05/20/2010  Job:  [id number]  Electronically Signed by Lance Muss MD on 05/26/2010 08:10:33 AM

## 2010-06-05 ENCOUNTER — Ambulatory Visit: Payer: Medicare PPO | Admitting: Gastroenterology

## 2010-07-18 NOTE — Discharge Summary (Signed)
[place]. [facility]  Patient:    SEARS, ORAN Visit Number: [id number] MRN: [id number]          Service Type: MED Location: 2000 2019 01 Attending Physician:  Meade Maw A Dictated by:   Anselm Lis, N.P. Admit Date:  01/14/2001 Discharge Date: 01/17/2001                             Discharge Summary  DATE OF BIRTH:  [birth year].  PROCEDURES:  (January 14, 2001) Stent proximal RCA; stent distal RCA. Inferior akinesis, EF 40 to 45%.  Negative MR.  DISCHARGE DIAGNOSES: 1. Coronary atherosclerotic heart disease:    a. Late presenting inferior myocardial infarction with peak CK of 1105,       MB fracture 216.6, troponin I 2.43.    b. January 14, 2001, stent of proximal RCA, 90% to less than 30%; stent       distal RCA with reduction of stenosis from 100% to 0%. 2. Atrial fibrillation early in admission; setting of inferior myocardial    infarction.  Noted to be in normal sinus rhythm by January 15, 2001,    without recurrence of dysrhythmia during the course of admission. 3. Dyslipidemia; on Pravachol. 4. Hypertension:  Good control on current medical regimen. 5. Obesity:  Weight on admission approximately 210 pounds. 6. History of depression:  On Prozac. 7. History of gastroesophageal reflux disease; on Prilosec.  PLAN: 1. The patient discharged home in stable condition. 2. Discharge medications:    a. Aspirin 81 mg per day.    b. (New) Plavix 75 mg once daily x 3 weeks, take with food.    c. Pravachol 40 mg p.o. q.d.    d. Toprol XL 50 mg p.o. q.d.    e. Prozac 20 mg p.o. q.d.    f. Prilosec 20 mg p.o. q.d.    g. Imipramine 75 mg p.o. q.d.    h. Nitroglycerin 0.4 sublingual p.r.n. chest pain. 3. Activity:  Light activity until office visit follow-up. 4. Diet:  Low cholesterol. 5. Wound care:  Call for problems from cath site such as swelling or    or tenderness. 6. Special instructions:  Do not smoke. 7. Follow-up:  Meade Maw, M.D., in approximately two weeks.  HISTORY OF PRESENT ILLNESS:  Mr. Cybulski is a very pleasant 55 year old gentleman with history of ongoing tobacco use, hypertension, who is status post stent proximal mid portion of the LAD.  Recurrent episode of chest pain. He underwent repeat coronary angiography April of 2002, which revealed that previously placed stents remained patent.  There was a haziness distal to the stent.  There was a 40 to 50% RCA lesion.  Subsequent stress Cardiolite was performed for further evaluation of haziness in the distal LAD which revealed fixed inferior defects which was felt to be likely secondary to diaphragmatic attenuation. There was some reversed distribution at the Apex.  EF was 54%. Evidence of anterior apical hypokinesis.  The patient presented with greater than 24-hour history of chest pain which he eventually took sublingual nitrates without relief.  He finally came to the emergency room after calling our office for advise.  EKG revealed ST elevation inferior leads, consistent with inferior MI.  Initial CK-MB and troponin I were elevated.  He had a leukocytosis with wbc of 15, consistent with stress response.  He was taken urgently to the cath lab by Dr. Fraser Din with the  following results: 1. Left ventriculogram:  Inferior akinesis with EF 40 to 45%.  Negative    MR. 2. Left main:  No significant disease. 3. LAD:  40% distal stenosis with patent stent proximal LAD. 4. Circumflex:  A 30 to 40% proximal OM1. 5. RCA:  A 70 to 80% proximal; distal right RCA totally occluded.  Subsequently, Dr. Katrinka Blazing performed a successful proximal RCA reduction of 90% to 30% and PTCA/stent distal RCA reduction from 100% to 0%.  The patient had episode of atrial fibrillation early on admission which was noted to have resolved with maintenance of NSR, on Dr. Faythe Ghee note, January 15, 2001.  The patient had no further episodes of chest discomfort during  admission.  His vital signs remained stable and he ambulated up and about with cardiac rehab with good tolerance.  The patient signed up for outpatient cardiac rehab.  He was counseled for smoking cessation.  LABORATORY TESTS AND DATA:  The wbc was 15.1, hemoglobin 15.9, hematocrit 46.4, platelets 235.  Protime 12.9, INR 1, PTT 26, sodium 140, potassium  4.6, chloride 103, CO2 31, glucose 130, BUN 13, creatinine 1.0.  LFTs with mildly elevated AST of 78, ALT 61, ALP at 118.  First CK of 636 with MB fraction 126.8 and troponin I of 2.43.  Second CK of 1105, MB fraction 216.  Third CK of 724, MB fraction 131.  Fourth CK of 392, MB fraction of 51.8.  Admission chest x-ray revealed no active disease.  EKG revealed findings consistent with inferior MI (1 mm ST elevation infection inferior leads).  NOTE:  Total time preparing this discharge was greater than 30 minutes including dictating discharge summary, review of and filling out prescriptions for medications and review of follow-up office visit with the patient. Dictated by:   Anselm Lis, N.P. Attending Physician:  Mora Appl DD:  01/24/01 TD:  01/24/01 Job: [id number]

## 2010-07-18 NOTE — Assessment & Plan Note (Signed)
[facility]                         GASTROENTEROLOGY OFFICE NOTE   BROWNING, SOUTHWOOD                MRN:          [id number]  DATE:03/23/2006                            DOB:          05-21-1955    Mark [name] is having no problems with his acid reflux as long as he takes  Nexium 40 mg a day.  He does have rather marked constipation, going to  the bathroom once a week, and says he has tried all laxatives and  medications without improvement.  He has never had a colonoscopy, which  I have recommended on several occasions; I will reset him up for this  exam as soon as possible.  I have renewed his Nexium and I have given  him some Nexium coupons.   His weight today is 231 pounds.  Blood pressure 122/88.  Pulse was 64  and regular.  His chest was generally clear. I could not appreciate murmurs, gallops  or rubs.  He had a rather large abdomen with a ventral hernia present.  There was  no definite organomegaly, masses or tenderness.  Bowel sounds were  normal.   RECOMMENDATIONS:  1. Outpatient colonoscopy.  2. Samples of Amitiza 24 mcg twice a day, and a prescription for such      if this should work on his bowels.  3. Screening laboratory parameters.  The patient does have a previous      history of fatty liver and has had a cholecystectomy.  4. Continue other multiple medications listed and reviewed in his      chart which he takes for hypertension and hypercholesterolemia.     Vania Rea. Jarold Motto, MD, Caleen Essex, FAGA  Electronically Signed    DRP/MedQ  DD: 03/23/2006  DT: 03/23/2006  Job #: [id number]   cc:   Oley Balm. Georgina Pillion, M.D.

## 2010-07-18 NOTE — H&P (Signed)
[place]. [facility]  Patient:    Mark [name], Mark [name]                    MRN: [id number] Adm. Date:  [id number] Disc. Date: [id number] Attending:  Mora Appl CC:         Oley Balm. Georgina Pillion, M.D.   History and Physical  PRIMARY PHYSICIAN:  Oley Balm. Georgina Pillion, M.D.  HISTORY:  Gwin Eagon is a 55 year old gentleman who presented to the emergency room with an episode of prolonged chest pain from approximately 2 P.M. to 5 P.M.  It felt as if an elephant was sitting on his chest.  He took Bayer aspirin at home and obtained some relief.  He subsequently presented to the pharmacy to refill his Clonidine.  While at the pharmacy he experienced an episode of dizziness and presented to the emergency room for further evaluation.  This is the patients first episode of chest pain.  He has significant coronary risk factors including male sex, family history, hypertension and tobacco abuse.  He continues to complain of dizziness and a pressure sensation behind his eyes.  He continues to have some shortness of breath.  REVIEW OF SYSTEMS:  Negative for fevers, chills, cough, sputum, ankle swelling, and calf tenderness.  There has been no tachyarrhythmia.  No orthopnea.  No bright red blood per rectum.  No black tarry stools.  PAST MEDICAL HISTORY:  Significant for hypertension, tobacco abuse, depression and restless leg syndrome.  PAST SURGICAL HISTORY:  Significant for cholecystectomy and repair of injury to the left arm.  MEDICATIONS:  His current medications include Elavil, Prilosec 20 mg p.o. q. day, atenolol 50 mg p.o. b.i.d., Prozac 20 mg p.o. q. day, and clonazepam 1 mg p.o. q. day.  SOCIAL HISTORY:  The patient is married.  He works as a Data processing manager. No history of alcohol abuse.  PHYSICAL EXAMINATION:  VITAL SIGNS:  On physical examination his blood pressure has ranged from 150/90, heart rate is 90, respiratory rate is 20 and O2 sat is 97%.   His telemetry has revealed a sinus rhythm with a rate of 70-90.  HEENT:  Unremarkable.  NECK:  No neck vein distention.  No carotid bruits.  Thyroid is not palpable.  LUNGS:  Pulmonary exam reveals breath sounds, which are equal and clear to auscultation.  HEART:  Cardiovascular exam reveals a normal S1 and normal S2.  Regular rate and rhythm.  No rubs, murmurs or gallops are noted.  ABDOMEN:  Soft and benign.  No epigastric tenderness noted.  EXTREMITIES:  Do not reveal peripheral edema.  SKIN:  Warm and dry.  NEUROLOGIC:  Nonfocal.  ANCILLARY DATA:  ECG reveals a normal sinus rhythm with normal R wave progression and no ischemic changes are noted.  LABORATORY DATA:  White count 9.5, hematocrit 44 and platelet count 251,000. INR 1.0.  CK total 103.  Troponin I 0.05.  Electrolytes are within normal limits.  Chest x-ray reveals some hyperinflation.  IMPRESSION: 1. Chest pain in a middle-aged male with significant cardiac risk factors    including hypertension, tobacco abuse, family history and male sex.  The patient will be admitted for observation, myocardial infarction ruled out by cardiac enzymes.  He will be started on aspirin, continue with his atenolol.  2. Hypertension, borderline control of his blood pressure.  We will continue with the atenolol and may need to add a diuretic for better blood pressure control; will monitor for now.  3. Tobacco  abuse.  His pH was noted to be 7.28 with a pCO2 of 67.  The patient will need to have pulmonary function tests for further evaluation. Smoking cessation was strongly encouraged.  If the patients cardiac enzymes are negative a stress Cardiolite will be planned as an outpatient procedure. DD:  10/04/99 TD:  10/04/99 Job: [id number]

## 2010-07-18 NOTE — Cardiovascular Report (Signed)
[name]. [facility]  Patient:    Mark [name], Mark [name] Visit Number: [id number] MRN: [id number]          Service Type: MED Location: 1800 1844 02 Attending Physician:  Devoria Albe Dictated by:   Meade Maw, M.D. Admit Date:  01/14/2001                          Cardiac Catheterization  PROCEDURE PERFORMED:  Left heart catheterization, coronary angiography, single plane ventriculogram.  INDICATION FOR PROCEDURE:  Acute inferior myocardial infarction.  DESCRIPTION OF PROCEDURE:  Patsy was brought to the catheterization lab on an emergent basis.  The right groin was prepped and draped in a usual sterile fashion.  Local anesthesia was achieved using 1% Xylocaine.  A 6-French hemostasis sheath was placed into the right femoral artery using the modified Seldinger technique.  Selective coronary angiography was performed using a JL4 and a nontorque right.  Single plane ventriculogram was performed in the RAO position using a 6-French pigtail curved catheter.  Multiple views were obtained.  All catheter exchanges were made over a guidewire.  The hemostasis sheath was flushed following each engagement.  FINDINGS:  The aortic pressure was 115/21.  LV pressure was 111/71.  There was no gradient noted on pullback.  There was no mitral regurgitation noted.  A single plane ventriculogram revealed inferior akinesis with ejection fraction of 40-45%.  Coronary angiography: 1. The left main coronary artery bifurcated into the left anterior descending    and circumflex vessel.  There was no significant disease in the left main    coronary artery. 2. Left anterior descending artery:  The left anterior descending gives rise    to a large D-1, a large D-2, and goes on as an apical recurrent branch.    The previously placed stent in the proximal LAD remained patent.  There was    a 40% distal stenosis noted. 3. Circumflex vessel:  The circumflex vessel gives rise to a  moderate OM-1,    large OM-2, and an AV groove vessel.  There was a 30-40% proximal lesion    noted in the OM-1. 4. Right coronary artery:  The right coronary artery is a large dominant    artery that gives rise to a 70-80% proximal lesion.  The distal right    coronary artery is totally occluded.  IMPRESSION:  Total occlusion of the distal right coronary artery with critical disease in the proximal right.  The previously placed stent remained patent. There was inferior akinesis.  The films will be reviewed with Dr. Katrinka Blazing. Further interventions pending his review. Dictated by:   Meade Maw, M.D. Attending Physician:  Devoria Albe DD:  01/14/01 TD:  01/14/01 Job: [id number]

## 2010-07-18 NOTE — Cardiovascular Report (Signed)
[place]. [facility]  Patient:    Mark [name], Mark [name]                    MRN: [id number] Proc. Date: 06/24/00 Adm. Date:  [id number] Attending:  Meade Maw A                        Cardiac Catheterization  PROCEDURE PERFORMED:  Left heart catheterization, coronary angiography, single plane ventriculogram.  INDICATIONS FOR PROCEDURE:  Recurrent chest pain similar to previous anginal pain.  Mr. Sebring has had revascularization with stent deployment in August of 2001.  At this time, a 0.014 Scimed, luge guidewire was advanced across the lesion without difficulty and a 3.0 x 12 mm Scimed, NIR Elite, intracoronary stent was placed.  Distal to this stent was a 4.0 x 15 mm Medtronic S7 intracoronary stent.  DESCRIPTION OF PROCEDURE:  After obtaining written informed consent, the patient was brought to the cardiac catheterization lab in the postabsorptive state.  Preoperative sedation was performed using IV Versed.  The right groin was prepped and draped in the usual sterile fashion.  Local anesthesia was achieved using 1% Xylocaine.  A 6 French hemostasis sheath was placed into the right femoral artery using the modified Seldinger technique.  Selective coronary angiography was performed using a JL4, JR4 Judkins catheter.  All kat exchange were made over a guidewire.  The hemostasis sheath was flushed following each engagement.  FINDINGS:  The aorta pressure was 116/81, LV pressure is 116/12.  There was no gradient noted on pullback.  There was no mitral regurgitation noted.  CORONARY ANGIOGRAPHY:  There was no significant calcification noted in the coronary tree.  Left main coronary artery:  The left main coronary artery bifurcated into the left anterior descending and circumflex vessel.  There was no significant disease in the left main coronary artery.  Left anterior descending:  The previously placed stent were noted.  The stents were patent.  Distal  to the second stent, there was a haziness with mild poststenotic dilatation.  The previous films were reviewed.  This dilatation was present at the time of the initial catheterization.  The left anterior descending gave rise to a small diagonal #1 and a moderate to large diagonal #2 and ended as an apical recurrent branch.  There was no disease noted in the first or second diagonal.  Circumflex vessel:  The circumflex vessel was a moderate sized vessel. It gave rise to a moderate OM-1 and large trifurcating OM-2 and went on to end as an AV groove vessel.  There was no significant disease in the circumflex or its branches.  Right coronary artery:  The right coronary artery is dominant for the posterior circulation.  There was a 30-40% proximal lesion noted in the right coronary artery.  There was significant spasm with this lesion at the time of engagement.  Intracoronary nitroglycerin was given with some relief in the spasm.  There persists a 40-50% stenotic lesion and a 30-40% distal lesion.  IMPRESSION: 1. Previously placed stents remain patent.  Haziness distal to the second    stent. 2. A 40-50% proximal right coronary artery lesion. 3. Preserved left ventricular function.  The films will be reviewed by Dr. Amil Amen.  Further interventions pending the outcome of his review.  The patient was given 3000 units of heparin and transferred to the holding area.  The hemostasis sheath was left in place. DD:  06/24/00 TD:  06/24/00  Job: [id number]

## 2010-07-18 NOTE — H&P (Signed)
[place]. [facility]  Patient:    Mark [name], Mark [name] Visit Number: [id number] MRN: [id number]          Service Type: MED Location: 1800 1844 02 Attending Physician:  Devoria Albe Dictated by:   Meade Maw, M.D. Admit Date:  01/14/2001   CC:         Oley Balm. Georgina Pillion, M.D.   History and Physical  REFERRING PHYSICIAN:  Dr. Onalee Hua B. Massey.  HISTORY:  Mark [name] is a 55 year old gentleman with known coronary artery disease.  He underwent a left heart catheterization in April of 2002. The previously placed stent in the proximal LAD had remained patent.  There was 40% distal stenosis to the stent.  The right coronary artery had a 40-50% proximal lesion.  He underwent a stress Cardiolite for further evaluation of the haziness to the distal stent.  There was a fixed inferior defect which was felt to be secondary to diaphragmatic attenuation.  There was reversed redistribution on the anterior apex.  His ejection fraction was 54%. At this time, he walked more than 10 minutes and 30 seconds on a full Bruce protocol.  Mark [name] notes that he had an onset of chest pain approximately 10 a.m. prior to the day of admission.  The chest pain persisted throughout the day.  He did not take sublingual nitroglycerin.  On the morning of admission, he again developed recurrent chest pain which was described as a 10/10 associated with nausea, shortness of breath and diaphoresis.  He took four sublingual nitroglycerins without relief, finally called the office and at that time was instructed to come to the emergency room.  PAST MEDICAL HISTORY: 1. Coronary artery disease, status post PTCA and stent deployment in the LAD    by Dr. Francisca December. 2. Hypertension. 3. Dyslipidemia. 4. Obesity. 5. Depression.  PAST SURGICAL HISTORY:  Significant for PTCA.  FAMILY HISTORY:  As noted on previous dictation.  REVIEW OF SYSTEMS:  He has had no fevers, no chills, no cough, no  peripheral edema, no orthopnea, no tachyarrhythmia.  He states that he has been compliant with his Pravachol; he had recently been noncompliant in fear of the liver problems associated with cholesterol-lowering medication.   PHYSICAL EXAMINATION:  GENERAL:  Physical exam reveals a middle-aged male who is now currently pain-free following treatment in the ER.  VITAL SIGNS:  His blood pressure is 113/70.  Heart rate is 86.  He is afebrile.  HEENT:  Unremarkable.  NECK:  There is no neck vein distention.  PULMONARY:  Examination reveals breath sounds which are equal and clear to auscultation.  No crackles are noted.  CARDIOVASCULAR:  Regular rate and rhythm.  Normal S1, normal S2.  No rubs, murmurs or gallops are noted.  ABDOMEN:  Soft, benign, nontender.  EXTREMITIES:  Distal pulses which are equal and palpable.  SKIN:  Warm and dry.  NEUROLOGIC:  Nonfocal.  LABORATORY DATA:  Initial CK is 636 with an MB of 126.  His troponin I is 2.43.  His white count is 15 with a platelet count of 235,000.  His INR is 1.0.  His creatinine is 1.0.  Potassium is 4.6.  ECG reviewed and reveals a sinus rhythm.  There is 1-mm ST elevation noted in the inferior leads.  Chest x-ray reveals no acute disease.  IMPRESSION: 1. Acute inferior myocardial infarction with ongoing stuttering chest pain.    We will take emergently to the catheterization laboratory for further    evaluation.  We will continue with heparin, nitroglycerin and Integrilin as    well as aspirin. 2. Dyslipidemia.  Lipid profiles have been obtained and are within normal    limits. 3. Hypertension.  Blood pressure is well-controlled. Dictated by:   Meade Maw, M.D. Attending Physician:  Devoria Albe DD:  01/14/01 TD:  01/14/01 Job: [id number]

## 2010-07-18 NOTE — H&P (Signed)
[place]. [facility]  Patient:    Mark [name], Mark [name]                      MRN: [id number] Adm. Date:  06/23/00 Attending:  Maisie Fus A. Patty Sermons, M.D. CC:         Oley Balm. Georgina Pillion, M.D.  Meade Maw, M.D.   History and Physical  CHIEF COMPLAINT: Chest pressure, shortness of breath, and dizziness.  HISTORY OF PRESENT ILLNESS: This patient is a 55 year old gentleman with known coronary artery disease.  In August 2001 the patient had an abnormal treadmill Cardiolite stress test showing anterior ischemia.  He had subsequent follow-up by Dr. Fraser Din which showed a very critical proximal LAD narrowing with a second narrowing more distally in the LAD.  Dr. Amil Amen then performed angioplasty and stenting of those two lesions, with excellent anatomic results.  The patient had done well until about the past two to three weeks, when he noticed increased fatigue and tonight developed extreme dizziness, pressure behind his eyes, and chest pressure.  There was no arm radiation and there was no nausea or vomiting.  The patient felt hot and flushed, and at one point was diaphoretic.  The patient was brought to the emergency room and evaluated and admitted.  CURRENT MEDICATIONS:  1. Atenolol 50 mg q.d.  2. Amitriptyline 150 mg q.h.s.  3. Prozac 20 mg q.d.  4. Prilosec 20 mg q.d.  5. Pravachol 20 mg q.d.  6. Enteric-coated aspirin 81 mg q.d.  FAMILY HISTORY: His father had coronary artery bypass graft surgery and died at age 65 of complications of cancer.  Mother is alive and well at age 53.  SOCIAL HISTORY: He is married and has two children.  He works as a Curator at [employer].  He has a history of smoking and continued to smoke after his angioplasty and stent.  He stopped smoking a week ago when it made him dizzy. He has had no alcohol intake to speak of.  PAST MEDICAL/SURGICAL HISTORY:  1. Cholecystectomy.  2. Elbow surgery.  ALLERGIES: No known drug  allergies.  REVIEW OF SYSTEMS: No history of any peptic ulcer but does have a history of reflux and hiatal hernia.  GENITOURINARY: No dysuria or hematuria.  The remainder of the Review Of Systems is unremarkable.  PHYSICAL EXAMINATION:  VITAL SIGNS: Pulse is 86 and regular, respirations are normal.  Blood pressure is 127/70.  HEENT: PERRL.  Mouth and pharynx normal.  NECK: Carotids normal.  Jugular venous pressure normal.  Thyroid normal.  LUNGS: Clear.  HEART: No murmurs, rubs, or gallops.  ABDOMEN: Soft, without hepatosplenomegaly or masses.  EXTREMITIES:  Good peripheral pulses.  No phlebitis or edema.  LABORATORY DATA: His electrocardiogram shows normal sinus rhythm and is within normal limits.  Chest x-ray shows no active disease.  Laboratory studies so far include a negative CK-MB and negative troponin. Normal CBC, normal electrolytes.  DIAGNOSTIC IMPRESSION:  1. Chest pain and pressure very similar to what he experienced in August of     2001 prior to his angioplasty and stenting of his left anterior     descending.  2. Hypercholesterolemia.  3. Cigarette abuse until one week ago.  PLAN: He is being admitted to telemetry and will be started on IV heparin and IV nitroglycerin, beta-blockers, aspirin, and Pravachol.  Anticipate probable cardiac catheterization on June 24, 2000 by Dr. Fraser Din. DD:  06/24/00 TD:  06/24/00 Job: [id number]

## 2010-07-18 NOTE — Discharge Summary (Signed)
NAME:  Mark [name], Mark [name]                       ACCOUNT NO.:  [id number]   MEDICAL RECORD NO.:  [id number]                   PATIENT TYPE:  INP   LOCATION:  6527                                 FACILITY:  [facility]   PHYSICIAN:  Meade Maw, M.D.                 DATE OF BIRTH:  1955/12/17   DATE OF ADMISSION:  08/11/2002  DATE OF DISCHARGE:  08/11/2002                                 DISCHARGE SUMMARY   ADMISSION DIAGNOSES:  1. Chest pain, rule out myocardial infarction.  2. Known coronary artery disease.  3. Tobacco use.  4. Hyperlipidemia.  5. Borderline low blood pressure.   DISCHARGE DIAGNOSES:  1. Chest pain, resolved, myocardial infarction ruled out with negative     enzymes; scheduled for outpatient stress test.  2. Tobacco use, has been counseled on cessation; refuses smoking cessation     consult.  3. Known coronary artery disease.  4. Hyperlipidemia.  5. Borderline low blood pressure.   HISTORY OF PRESENT ILLNESS:  This is a 55 year old white male with a history  of coronary disease, status post stenting of the LAD in 2001 and with patent  cath in 2002.  Has a history of anxiety and ongoing tobacco use.  Patient  came to the emergency room after three hours of substernal chest pressure,  precipitated by an argument with his child.  He took a total of two  nitroglycerin, Prozac, imipramine, and clonazepam without relief.  In the  ED, EKG was nonacute, but pain continued, so the patient was admitted for  rule out MI.   PROCEDURES:  None.   COMPLICATIONS:  None.   CONSULTATIONS:  None.   HOSPITAL COURSE:  Mr. Valade was admitted to the rule-out MI unit early in  the morning of August 11, 2002, treated with IV nitroglycerin for pain relief  and morphine p.r.n., Lovenox for prophylaxis.  The patient remained pain-  free during the remainder of his short hospital stay.  Cardiac enzymes  negative times two, and EKG remained nonacute.   The patient was seen by Dr.  Fraser Din on the late morning of August 11, 2002,  and felt that the patient was stable for discharge to home and recommended  outpatient stress testing.  Prior to that, she did offer an inpatient  cardiac cath to define coronary anatomy, but the patient refused.  She also  talked about smoking cessation, but the patient refused a smoking cessation  consult.   DISCHARGE MEDICATIONS:  1. Toprol XL 100 mg per day - take one-half of a pill over the weekend and     come for a blood pressure check on Monday at the office (blood pressure     has been running low, about 100/50, since in the hospital), asymptomatic.  2. Lipitor 40 mg at night.  3. Niaspan 500 mg.  4. Aspirin 325 mg.  5. Prozac 20 mg per day.  6. Clonazepam  1 mg one and one-half tablets at bedtime.  7. Imipramine 50 mg as directed.  8. Ambien 10 mg as needed for sleep.  9. Nitroglycerin as needed for chest pain.   ACTIVITY:  As tolerated.   DIET:  Low-fat, low-cholesterol, low-salt diet.   DISCHARGE INSTRUCTIONS:  1. He is to stop smoking.  2. He was asked to call the office if any problems or questions.  3. Stress test is scheduled for August 21, 2002, at 9:30 in the morning at Dr.     Lindell Spar office, and he is to call if he has any problems or questions     in the interim.     Georgiann Cocker Jernejcic, P.A.                   Meade Maw, M.D.    TCJ/MEDQ  D:  08/11/2002  T:  08/12/2002  Job:  [id number]   cc:   Meade Maw, M.D.  [address]  [place]  [place] [address]  Fax: [phone]   Oley Balm. Georgina Pillion, M.D.  [address]  [place]  [place] [address]  Fax: [phone]

## 2010-07-18 NOTE — Cardiovascular Report (Signed)
[place]. [facility]  Patient:    Mark [name], Mark [name] Visit Number: [id number] MRN: [id number]          Service Type: MED Location: 1800 [id number] Attending Physician:  Devoria Albe Dictated by:   Darci Needle, M.D. Proc. Date: 01/14/01 Admit Date:  01/14/2001   CC:         Oley Balm. Georgina Pillion, M.D.  Meade Maw, M.D.   Cardiac Catheterization  INDICATION:  Late presenting acute infarction greater than 24 hours old.  The patient has acute distal right coronary artery occlusion.  PROCEDURES PERFORMED: 1. Stent, proximal right coronary artery 2. Stent, distal right coronary artery.  CARDIOLOGIST:  Darci Needle, M.D.  DESCRIPTION:  After informed consent, the patient had guiding shots performed using a 6-French JR guide catheter.  We used the 6-French sheath placed during the diagnostic procedure by Dr. Fraser Din.  A BMW wire was used to enter the right coronary, but we were unable to cross distally.  We then used a Maverick 2.5 mm over-the-wire balloon after docking the BMW wire.  We used this as a transport conduit to use a Luge wire the cross the distal right coronary. After we had successfully crossed, we dilated with a 2.5 mm Maverick balloon and then performed angioplasty with this balloon both distally and proximally. We then placed a 20 mm long 3.0 mm Express 2 stent distally and deployed the stent to 16 atmospheres.  We also placed a proximal 16 mm 3.0 diameter stent and deployed it to 16 atmospheres.  Following the procedure, the very proximal part of the LAD in front of the stent was somewhat angulated, and there appeared to be as much as 30% narrowing.  After viewing this, I did not do any further balloon angioplasty or stenting, although I did contemplate that.  I believe this region represents angulation due to the stent placement in a region that had been previously quite a bit more angled.  TIMI flow postprocedure was 3.  There was  0% distal stenosis, 0% proximal stenosis but, as mentioned above, perhaps 30% narrowing in the proximal portion of the artery before the stent ostium that I believe is related to angulation.  ACT postprocdure was 185, and 1000 units of additional heparin was administered.  The patient was already on IV Integrilin.  The patient received 4000 units of heparin before starting the procedure.  ASSESSMENT:  Successful recanalization of the right coronary with reduction in stenosis from 90% proximally to less than 30% and from 100% distally to 0%. Dictated by:   Darci Needle, M.D. Attending Physician:  Devoria Albe DD:  01/14/01 TD:  01/14/01 Job: [id number]

## 2010-07-18 NOTE — H&P (Signed)
NAME:  Mark [name], Mark [name] NO.:  [id number]   MEDICAL RECORD NO.:  [id number]                   PATIENT TYPE:  EMS   LOCATION:  MAJO                                 FACILITY:  [facility]   PHYSICIAN:  Darden Palmer., M.D.         DATE OF BIRTH:  1955-10-05   DATE OF ADMISSION:  DATE OF DISCHARGE:                                HISTORY & PHYSICAL   REASON FOR ADMISSION:  Chest pressure.   HISTORY:  This 55 year old male is brought in for evaluation of chest pain  to rule out a myocardial infarction.  The patient has a prior history of  coronary artery disease and presented with significant chest pain in 2001.  At that time, he had stenting of the proximal LAD with a 4.0 S-7 Medtronics  stent and 3.0 x12 mm NIR stent by Dr. Amil Amen.  Because of recurrent chest  pain one year later, he was re catheterized in 2002 showing the stented  sites to be widely patent.  He also had some slight haziness distal to the  stent, and had a Cardiolite which was reportedly negative. He had 40-50  right coronary artery disease, and 20-30% distal stenosis.  He had preserved  LV function.  He has been getting along relatively well.  He saw Dr. Fraser Din  because of increased sweating recently.  He was in an argument with one of  his children this evening and developed substernal pressure which  intensified in the course of the argument. He had no sweating with it, but  was mildly short of breath.  He took Prozac, clonazepam, imipramine, as well  as nitroglycerin without relief of his symptoms, and came to the emergency  room.  He has had continued chest pain in the emergency room despite  negative cardiac enzymes and a normal electrocardiogram.  Because of his  prior cardiac history, he is admitted at this time to rule out myocardial  infarction.  He rates his symptoms as 4 or 5 out of 10, but is uncertain  whether they are anxiety related or are related to his heart  disease.   PAST MEDICAL HISTORY:  Remarkable for hyperlipidemia.  He also has some mild  hypotension.  He has a history of an anxiety and panic disorder previously.  He also has restless legs at night.   PREVIOUS SURGERY:  Gallbladder surgery.  He has had left elbow surgery also.   ALLERGIES:  None.   CURRENT MEDICATIONS:  1. Toprol XL 100 mg daily.  2. Lipitor 40 mg daily.  3. Lanoxin 500 q.h.s.  4. Aspirin 325 daily.  5. Prozac 20 mg daily.  6. Clonazepam 1.5 mg q.h.s.  7. Ambien 5 mg daily.  8. Imipramine 150 mg q.h.s.   FAMILY HISTORY:  His father had a myocardial infarction in his 37's.  A  grandfather died at age 36 of myocardial infarction.  Other family members  appear healthy.  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  He  smokes one pack of cigarettes per day.  He does not use alcohol to excess.  Some situational stress present within the house.   REVIEW OF SYSTEMS:  He wears eye glasses.  He has no other eyes, ears, nose  or throat problems.  He has had significant sweating previously.  He has  difficulty with restless legs and nocturnal cramps in his legs.  He has mild  urinary urgency.  He also has some mild dyspepsia at times.  Other than  stated above, the remainder of review of systems is unremarkable.   PHYSICAL EXAMINATION:  GENERAL:  He is a middle-aged male who appears older  than stated age of 69.  VITAL SIGNS:  Blood pressure 130/70, pulse is currently 70 and regular.  SKIN:  Warm and dry.  HEENT:  EOMI/ PERRLA.  Fundi not examined.  Pharynx is negative.  NECK:  Supple without masses, thyromegaly, JVD or bruits.  LUNGS:  Clear to A&P.  CARDIAC:  On exam, normal S1 and S2.  No S3, S4 or murmur.  ABDOMEN:  Soft, nontender, no hepatosplenomegaly, mass or aneurysm.  EXTREMITIES:  Femoral and distal pulses are 2+.   He did not appear to be in any acute distress, complaining of chest pain and  was not diaphoretic.  His  12-lead ECG is normal.  CPK, troponin I are  negative.   IMPRESSION:  1. Prolonged chest discomfort in a patient with known coronary artery     disease precipitated by emotional distress, rule out myocardial     infarction or unstable angina.  2. Coronary artery disease with previous stenting of the left anterior     descending, residual disease in the right coronary artery on     catheterization two years ago.  3. Cigarette abuse, ongoing.  4. Panic disorder and anxiety in the past.  5. Hyperlipidemia.  6. History of restless legs.   RECOMMENDATION:  1. Begin Lovenox.  2. Intravenous nitroglycerin.  3. Admit to rule out myocardial infarction.  Keep NPO.  4. Further workup for by Dr. Fraser Din.                                               Darden Palmer., M.D.    WST/MEDQ  D:  08/11/2002  T:  08/11/2002  Job:  [id number]   cc:   Candace Cruise, Dr.   Oley Balm. Georgina Pillion, M.D.  [address]  [place]  [place] [address]  Fax: [phone]

## 2010-07-18 NOTE — Cardiovascular Report (Signed)
[place]. [facility]  Patient:    Mark [name], Mark [name]                    MRN: [id number] Adm. Date:  [id number] Attending:  Mora Appl CC:         Oley Balm. Georgina Pillion, M.D.   Cardiac Catheterization  PROCEDURES PERFORMED:  Left heart catheterization, coronary angiography, single plane ventriculogram.  INDICATION FOR PROCEDURE:  Reversible ischemia on Cardiolite.  PROCEDURE:  After obtaining written informed consent, the patient was brought to the cardiac catheterization lab in the postabsorptive state.  Preoperative sedation was achieved using IV Versed.  The right groin was prepped and draped in the usual sterile fashion.  Local anesthesia was achieved using 1% Xylocaine.  A 6-French hemostasis sheath was placed into the right femoral artery using the modified Seldinger technique.  Selective coronary angiography was performed using a JL4-JR4 Judkins catheter.  All catheter exchanges were made over a guide wire.  A single plane ventriculogram was performed in the RAO position using a straight pigtail curved catheter.  Multiple views were obtained.  FINDINGS:  The aortic pressure was 100/64.  LV pressure was 118/20.  Single plane ventriculogram revealed normal wall motion, ejection fraction of approximately 60%.  Coronary angiography: 1. The left main coronary artery bifurcated into the left anterior descending    and circumflex vessel.  There was no significant disease in the left main    coronary artery. 2. The left anterior descending artery gave rise to a D-1 and then had a 90%    stenotic lesion which involved the first septal perforator.  There was also    a mid LAD lesion.  The LAD went on to give rise to a D-2 and ended as an    apical recurrent branch. 3. The circumflex vessel gave rise to a moderate OM-1, OM-2, and an AV groove    vessel.  There was no significant disease in the circumflex vessel. 4. The right coronary artery was dominant  and gave rise to two RV marginals    and a PDA.  There was no significant disease in the right coronary artery    or its branches.  IMPRESSION: 1. Critical disease involving the proximal LAD and borderline disease in the    mid LAD. 2. Preserved LV function.  RECOMMENDATION:  Dr. Amil Amen was consulted and will proceed with angioplasty of the LAD lesion.DD:  10/29/99 TD:  10/29/99 Job: [id number]

## 2010-07-18 NOTE — Procedures (Signed)
. [facility]  Patient:    Mark [name], Mark [name]                    MRN: [id number] Proc. Date: 10/29/99 Adm. Date:  [id number] Attending:  Meade Maw A CC:         Meade Maw, M.D.  Oley Balm Georgina Pillion, M.D.  Cardiac Catheterization Lab   Procedure Report  PROCEDURE PERFORMED:  Percutaneous coronary intervention/stent implantation proximal and mid left anterior descending artery.  INDICATIONS:  [name] is a 55 year old man with atypical angina who underwent a myocardial perfusion study showing reversible anterior ischemia. Dr. Fraser Din completed a coronary angiography revealing tandem lesions in the proximal and mid portion of the LAD amounting to 95% and 70% stenoses, respectively.  He is to undergo catheter-based revascularization at this time.  DESCRIPTION OF PROCEDURE:  The previously placed 6-French catheter sheath was exchanged over a long guiding J wire for a 7-French catheter sheath.  A 6-French catheter sheath was inserted into the right femoral vein utilizing an anterior approach over a guiding J wire for intravenous access.  A 7-French FL4 Scimed Wiseguide guiding catheter was advanced to the ascending aorta where the left coronary os was engaged.  A 0.014 Scimed luge intracoronary guide wire was advanced across the lesion without difficulty.  Initial stent implantation in the more distal lesion was performed using a 3.0 x 12-mm Scimed NIR Elite intracoronary stent.  The device was deployed at 16 atmospheres for 45 seconds.  That balloon was removed, and a 4.0 x 15-mm Medtronic S7 intracoronary stent was advanced across the lesion in the proximal segment.  This device was deployed at a peak pressure of 15 atmospheres for 45 seconds.  After confirmation of adequate patency in orthogonal views both with and without the guide wire in place, the guiding catheter and guide wire were removed.  Hemostasis was achieved by suturing the  sheaths in place.  The patient was transported to the recovery area in stable condition with intact distal pulses.  ANGIOGRAPHIC DATA:  As mentioned, the lesions treated were in the mid and proximal portions of the left anterior descending artery and were 70% and 95% stenotic, respectively.  Following balloon dilatation and stent implantation, there were no residual stenoses in either portion of the artery.  It should be noted the patient experienced mild chest tightness and dyspnea during balloon inflation and stent implantation.  Following implantation of the Medtronic 4-mm stent, the patient complained of throat discomfort as well as left arm pain and persistent dyspnea.  Intracoronary nitroglycerin was administered, and a repeat angiography was performed which revealed wide patency of all vessels and branches.  There were no electrocardiographic changes.  The patient remained hemodynamically stable.  FINAL IMPRESSION: 1. Atherosclerotic coronary vascular disease, single-vessel. 2. Status post successful percutaneous transluminal coronary angioplasty stent    implantation, proximal and mid left anterior descending artery. DD:  10/29/99 TD:  10/29/99 Job: [id number]

## 2010-08-01 LAB — COMPREHENSIVE METABOLIC PANEL
ALT: 18 U/L (ref 10–40)
BUN: 17 mg/dL (ref 4–21)
Creat: 1.07
Glucose: 97
Sodium: 137 mmol/L (ref 137–147)

## 2010-10-03 ENCOUNTER — Telehealth: Payer: Self-pay | Admitting: Gastroenterology

## 2010-10-03 NOTE — Telephone Encounter (Signed)
Message copied by Leanor Kail I on Fri Oct 03, 2010  1:40 PM ------      Message from: Harlow Mares D      Created: Thu Jun 05, 2010 11:24 AM       Bill patient for no show. Per Dr Jarold Motto

## 2011-01-29 ENCOUNTER — Encounter: Payer: Self-pay | Admitting: Family Medicine

## 2011-01-29 ENCOUNTER — Ambulatory Visit (INDEPENDENT_AMBULATORY_CARE_PROVIDER_SITE_OTHER): Payer: Medicare Other | Admitting: Family Medicine

## 2011-01-29 VITALS — BP 122/78 | HR 80 | Temp 98.5°F | Ht 69.5 in | Wt 224.2 lb

## 2011-01-29 DIAGNOSIS — Z639 Problem related to primary support group, unspecified: Secondary | ICD-10-CM | POA: Insufficient documentation

## 2011-01-29 DIAGNOSIS — I219 Acute myocardial infarction, unspecified: Secondary | ICD-10-CM

## 2011-01-29 DIAGNOSIS — I10 Essential (primary) hypertension: Secondary | ICD-10-CM

## 2011-01-29 DIAGNOSIS — M549 Dorsalgia, unspecified: Secondary | ICD-10-CM

## 2011-01-29 DIAGNOSIS — J40 Bronchitis, not specified as acute or chronic: Secondary | ICD-10-CM

## 2011-01-29 MED ORDER — METOPROLOL TARTRATE 50 MG PO TABS: 75.0000 mg | ORAL_TABLET | Freq: Two times a day (BID) | ORAL | Status: DC

## 2011-01-29 MED ORDER — GUAIFENESIN-CODEINE 100-10 MG/5ML PO SYRP: 5.0000 mL | ORAL_SOLUTION | Freq: Every evening | ORAL | Status: DC | PRN

## 2011-01-29 MED ORDER — AZITHROMYCIN 250 MG PO TABS: ORAL_TABLET | ORAL | Status: AC

## 2011-01-29 NOTE — Assessment & Plan Note (Signed)
Anticipate viral.  Going on 1 wk. If not improving, may fill zpack. Sent home with cheratussin.

## 2011-01-29 NOTE — Progress Notes (Signed)
Subjective:    Patient ID: Mark [name], male    DOB: 09-Dec-1955, 55 y.o.   MRN: [id number]  HPI CC: new pt establish  Prior saw PCP [facility].  Also saw Dr. Armanda Magic with [facility].  To start seeing new doc as she has left practice.  No chest pain or SOB recently.  Currently with head cold.  sxs going on 1 wk.  Feeling chest congestion, coughing when laying down.  Wet sounding but not productive.  Has tried nothing for this so far.  No fevers/chills, abd pain, HA, ear pain, tooth pain.  No sick contacts.  Adopted 2 granddaughters - 46 and 70 yo.  Wants medicine for nerves because "they're driving me crazy".  Goes to [facility] for back pain - under pain contract with them.  Dx with fibromyalgia, and spinal stenosis.  No surgeries in past.  Recently hospitalized for back pain.  Preventative: Last CPE unsure.  Blood work unsure. Colonoscopy - has had, unsure when Tetanus - thinks 2012 Flu - declines.  Caffeine: none Lives with wife and 2 grandchildren, 1 dog, 1 cat Occupation: Retired, was Dispensing optician Activity: no regular activity Diet: water daily, occasional fruits/vegetables, daily red meat, no fish  Medications and allergies reviewed and updated in chart.  Past histories reviewed and updated if relevant as below. Patient Active Problem List  Diagnoses  . B12 DEFICIENCY  . HYPERTENSION  . AMI  . GERD  . CONSTIPATION  . OTHER CONSTIPATION  . ALLERGY   Past Medical History  Diagnosis Date  . AMI (acute myocardial infarction) 2002    PCI with 2 stents, done well since then  . HLD (hyperlipidemia)   . HTN (hypertension)   . GERD (gastroesophageal reflux disease)     seldom  . Back pain     spinal stenosis and fibromyalgia   Past Surgical History  Procedure Date  . Cholecystectomy   . Coronary angioplasty with stent placement 2002   History  Substance Use Topics  . Smoking status: Former Smoker    Quit date:  03/02/2000  . Smokeless tobacco: Never Used  . Alcohol Use: No   Family History  Problem Relation Age of Onset  . Colon cancer Neg Hx   . Stroke Neg Hx   . Diabetes Neg Hx   . Cancer Father     liver  . Coronary artery disease Father    No Known Allergies Current Outpatient Prescriptions on File Prior to Visit  Medication Sig Dispense Refill  . imipramine (TOFRANIL) 50 MG tablet Take 100 mg by mouth at bedtime.       Marland Kitchen lubiprostone (AMITIZA) 24 MCG capsule Take 24 mcg by mouth 2 (two) times daily with a meal.        . oxyCODONE (ROXICODONE) 15 MG immediate release tablet Take 15 mg by mouth every 4 (four) hours as needed.        Review of Systems  Constitutional: Negative for fever, chills, activity change, appetite change, fatigue and unexpected weight change.  HENT: Negative for hearing loss and neck pain.   Eyes: Negative for visual disturbance.  Respiratory: Positive for cough and wheezing. Negative for chest tightness and shortness of breath.   Cardiovascular: Negative for chest pain, palpitations and leg swelling.  Gastrointestinal: Negative for nausea, vomiting, abdominal pain, diarrhea, constipation, blood in stool and abdominal distention.  Genitourinary: Negative for hematuria and difficulty urinating.  Musculoskeletal: Positive for back pain. Negative for myalgias and arthralgias.  Skin: Negative for rash.  Neurological: Negative for dizziness, seizures, syncope and headaches.  Hematological: Does not bruise/bleed easily.  Psychiatric/Behavioral: Negative for dysphoric mood. The patient is not nervous/anxious.        Objective:   Physical Exam  Nursing note and vitals reviewed. Constitutional: He is oriented to person, place, and time. He appears well-developed and well-nourished. No distress.  HENT:  Head: Normocephalic and atraumatic.  Right Ear: Hearing, tympanic membrane, external ear and ear canal normal.  Left Ear: Hearing, tympanic membrane, external ear  and ear canal normal.  Nose: Mucosal edema present. No rhinorrhea. Right sinus exhibits no maxillary sinus tenderness and no frontal sinus tenderness. Left sinus exhibits no maxillary sinus tenderness and no frontal sinus tenderness.  Mouth/Throat: Uvula is midline, oropharynx is clear and moist and mucous membranes are normal. No oropharyngeal exudate, posterior oropharyngeal edema, posterior oropharyngeal erythema or tonsillar abscesses.       congestion  Eyes: Conjunctivae and EOM are normal. Pupils are equal, round, and reactive to light. No scleral icterus.  Neck: Normal range of motion. Neck supple. No thyromegaly present.  Cardiovascular: Normal rate, regular rhythm, normal heart sounds and intact distal pulses.   No murmur heard. Pulses:      Radial pulses are 2+ on the right side, and 2+ on the left side.  Pulmonary/Chest: Effort normal and breath sounds normal. No respiratory distress. He has no wheezes. He has no rales.  Abdominal: Soft. Bowel sounds are normal. He exhibits no distension and no mass. There is no tenderness. There is no rebound and no guarding.  Musculoskeletal: Normal range of motion. He exhibits no edema.       clubbing  Lymphadenopathy:    He has no cervical adenopathy.  Neurological: He is alert and oriented to person, place, and time.       CN grossly intact, station and gait intact  Skin: Skin is warm and dry. No rash noted.  Psychiatric: He has a normal mood and affect. His behavior is normal. Judgment and thought content normal.      Assessment & Plan:

## 2011-01-29 NOTE — Patient Instructions (Addendum)
I will request records from [facility]. Monitor nerves for now.  If becoming overwhelming or more bothersome, let me know. For congestion - sounds like developing bronchitis.  Treat with mucinex DM with plenty of fluid as well as plenty of fluid and rest.  For cough may do cough syrup for night time. Zpack to hold on to in case not improving as expected - but give this a few more days as it may just be viral infection. Return at your convenience in the next few months for physical, come in a few days prior fasting for blood work. Good to meet you today! Call us with questions.

## 2011-01-29 NOTE — Assessment & Plan Note (Signed)
Discussed normal stress vs overwhelming stress/anxiety. Pt prefers to monitor sxs for now, will let me know if stress becoming troubling

## 2011-01-29 NOTE — Assessment & Plan Note (Signed)
Followed by pain clinic.  

## 2011-01-29 NOTE — Assessment & Plan Note (Signed)
Stable on current regimen (metoprolol) Will request records from prior PCP

## 2011-01-29 NOTE — Assessment & Plan Note (Signed)
Stable

## 2011-02-20 ENCOUNTER — Other Ambulatory Visit: Payer: Self-pay | Admitting: *Deleted

## 2011-02-20 MED ORDER — GUAIFENESIN-CODEINE 100-10 MG/5ML PO SYRP: 5.0000 mL | ORAL_SOLUTION | Freq: Every evening | ORAL | Status: AC | PRN

## 2011-02-20 NOTE — Telephone Encounter (Signed)
May phone in

## 2011-02-20 NOTE — Telephone Encounter (Signed)
Pharmacy called pt is requesting refill of cough med.

## 2011-02-20 NOTE — Telephone Encounter (Signed)
Rx called in as directed.   

## 2011-02-22 ENCOUNTER — Encounter: Payer: Self-pay | Admitting: Family Medicine

## 2011-03-04 ENCOUNTER — Encounter: Payer: Self-pay | Admitting: Family Medicine

## 2011-12-11 ENCOUNTER — Other Ambulatory Visit: Payer: Self-pay | Admitting: Family Medicine

## 2011-12-11 NOTE — Telephone Encounter (Signed)
Electronic refill request.  Patient has not been here in almost a year and no upcoming appts scheduled.  Please advise.

## 2011-12-11 NOTE — Telephone Encounter (Signed)
Refilled x 3 mo but will need OV for f/u.

## 2011-12-28 ENCOUNTER — Telehealth: Payer: Self-pay | Admitting: Family Medicine

## 2011-12-28 NOTE — Telephone Encounter (Signed)
Ok by me

## 2011-12-28 NOTE — Telephone Encounter (Signed)
Ok with me if ok with Dr. G 

## 2012-01-04 ENCOUNTER — Ambulatory Visit (INDEPENDENT_AMBULATORY_CARE_PROVIDER_SITE_OTHER): Payer: Medicare PPO | Admitting: Family Medicine

## 2012-01-04 ENCOUNTER — Encounter: Payer: Self-pay | Admitting: Family Medicine

## 2012-01-04 VITALS — BP 130/90 | HR 80 | Temp 98.0°F | Wt 222.0 lb

## 2012-01-04 DIAGNOSIS — I10 Essential (primary) hypertension: Secondary | ICD-10-CM

## 2012-01-04 DIAGNOSIS — Z79899 Other long term (current) drug therapy: Secondary | ICD-10-CM

## 2012-01-04 DIAGNOSIS — Z136 Encounter for screening for cardiovascular disorders: Secondary | ICD-10-CM

## 2012-01-04 DIAGNOSIS — F329 Major depressive disorder, single episode, unspecified: Secondary | ICD-10-CM | POA: Insufficient documentation

## 2012-01-04 LAB — LIPID PANEL
Cholesterol: 235 mg/dL — ABNORMAL HIGH (ref 0–200)
Triglycerides: 341 mg/dL — ABNORMAL HIGH (ref 0.0–149.0)

## 2012-01-04 LAB — COMPREHENSIVE METABOLIC PANEL
Albumin: 4.5 g/dL (ref 3.5–5.2)
BUN: 20 mg/dL (ref 6–23)
Calcium: 9.5 mg/dL (ref 8.4–10.5)
Chloride: 100 mEq/L (ref 96–112)
GFR: 64.66 mL/min (ref >60.00)
Glucose, Bld: 132 mg/dL — ABNORMAL HIGH (ref 70–99)
Potassium: 4.6 mEq/L (ref 3.5–5.1)

## 2012-01-04 MED ORDER — BUPROPION HCL ER (XL) 150 MG PO TB24: 150.0000 mg | ORAL_TABLET | Freq: Every day | ORAL | Status: DC

## 2012-01-04 NOTE — Progress Notes (Signed)
Subjective:    Patient ID: Mark [name], male    DOB: 11-02-1955, 56 y.o.   MRN: [id number]  HPI  56 yo male new to me here to discuss:  Depression- past several years, increased tearfulness.  Lately, feels like he just wants to lay in bed all day.  Has two grand daughters who he adores and does not want to interact with them much. He has had issues with depression in past.  No anxiety.  He is on disability due to chronic back pain- followed by pain management.  Not sleeping well.  Appetite ok.  No SI or HI.  Does not have panic attacks.  He is unsure of any family h/o psychiatric issues.  Has not had blood work done since last June.  He does have h/o CAD s/p bare metal stent and HTN.  Patient Active Problem List  Diagnosis  . B12 DEFICIENCY  . HYPERTENSION  . CAD (coronary artery disease)  . GERD  . OTHER CONSTIPATION  . Back pain  . Bronchitis  . Unspecified family circumstance  . Depression   Past Medical History  Diagnosis Date  . CAD (coronary artery disease) 2001    PCI of LAD and RCA, done well since then  . HLD (hyperlipidemia)     intolerant lipitor, crestor  . HTN (hypertension)   . GERD (gastroesophageal reflux disease)     seldom  . Back pain     spinal stenosis and fibromyalgia  . Obesity   . Ischemic cardiomyopathy     EF 50% by stress test 2010  . Colon polyps 2009    rec rpt 1 yr   Past Surgical History  Procedure Date  . Cholecystectomy   . Coronary angioplasty with stent placement 2001    bare metal LAD, RCA  . Cardiovascular stress test 2010    no ischemia,. EF 51%, 7 METS  . Colonoscopy 11/2007    mult polyps, rec rpt 1 yr   History  Substance Use Topics  . Smoking status: Former Smoker    Quit date: 03/02/2000  . Smokeless tobacco: Never Used  . Alcohol Use: No   Family History  Problem Relation Age of Onset  . Colon cancer Neg Hx   . Stroke Neg Hx   . Diabetes Neg Hx   . Cancer Father     liver  . Coronary artery disease  Father    Allergies  Allergen Reactions  . Niaspan (Niacin Er) Other (See Comments)    Skin red, hair falling out   Current Outpatient Prescriptions on File Prior to Visit  Medication Sig Dispense Refill  . aspirin 81 MG tablet Take 81 mg by mouth daily.        . [DISCONTINUED] metoprolol (LOPRESSOR) 50 MG tablet TAKE 1 AND 1/2 TABLETS BY MOUTH TWICE DAILY  90 tablet  3  . buPROPion (WELLBUTRIN XL) 150 MG 24 hr tablet Take 1 tablet (150 mg total) by mouth daily.  30 tablet  1   The PMH, PSH, Social History, Family History, Medications, and allergies have been reviewed in [facility], and have been updated if relevant.    Review of Systems See HPI    Objective:   Physical Exam BP 130/90  Pulse 80  Temp 98 F (36.7 C)  Wt 222 lb (100.699 kg) General:  overweght male in NAD Eyes:  PERRL Ears:  External ear exam shows no significant lesions or deformities.  Otoscopic examination reveals clear canals, tympanic membranes are  intact bilaterally without bulging, retraction, inflammation or discharge. Hearing is grossly normal bilaterally. Nose:  External nasal examination shows no deformity or inflammation. Nasal mucosa are pink and moist without lesions or exudates. Mouth:  Oral mucosa and oropharynx without lesions or exudates.  eduntulous Psych:  Tearful, good eye contact, very pleasant and appropriate.      Assessment & Plan:   1. HYPERTENSION  Stable on current rx.  Comprehensive metabolic panel  2. Screening for ischemic heart disease  Lipid Panel  3. Depression  New- discuss treatment options for depression.  He would like to defer psychotherapy at this time.  Start Wellbutrin 150 mg XL- 1 tablet daily and follow up in 1 month. The patient indicates understanding of these issues and agrees with the plan.

## 2012-01-04 NOTE — Patient Instructions (Addendum)
It was nice to meet you.  We are starting Wellbutrin 150 mg XL daily. Please call me in 3-4 weeks with an update of your symptoms.

## 2012-01-21 ENCOUNTER — Encounter: Payer: Self-pay | Admitting: *Deleted

## 2012-01-27 ENCOUNTER — Emergency Department (HOSPITAL_COMMUNITY): Payer: Medicare Other

## 2012-01-27 ENCOUNTER — Encounter (HOSPITAL_COMMUNITY): Payer: Self-pay | Admitting: Emergency Medicine

## 2012-01-27 ENCOUNTER — Emergency Department (HOSPITAL_COMMUNITY)
Admission: EM | Admit: 2012-01-27 | Discharge: 2012-01-27 | Disposition: A | Discharge status: Home / Self Care | Payer: Medicare Other | Attending: Emergency Medicine | Admitting: Emergency Medicine

## 2012-01-27 DIAGNOSIS — I2510 Atherosclerotic heart disease of native coronary artery without angina pectoris: Secondary | ICD-10-CM | POA: Insufficient documentation

## 2012-01-27 DIAGNOSIS — I428 Other cardiomyopathies: Secondary | ICD-10-CM | POA: Insufficient documentation

## 2012-01-27 DIAGNOSIS — F29 Unspecified psychosis not due to a substance or known physiological condition: Secondary | ICD-10-CM | POA: Insufficient documentation

## 2012-01-27 DIAGNOSIS — Z79899 Other long term (current) drug therapy: Secondary | ICD-10-CM | POA: Insufficient documentation

## 2012-01-27 DIAGNOSIS — Z87891 Personal history of nicotine dependence: Secondary | ICD-10-CM | POA: Insufficient documentation

## 2012-01-27 DIAGNOSIS — Z860100 Personal history of colon polyps, unspecified: Secondary | ICD-10-CM | POA: Insufficient documentation

## 2012-01-27 DIAGNOSIS — I10 Essential (primary) hypertension: Secondary | ICD-10-CM | POA: Insufficient documentation

## 2012-01-27 DIAGNOSIS — E785 Hyperlipidemia, unspecified: Secondary | ICD-10-CM | POA: Insufficient documentation

## 2012-01-27 DIAGNOSIS — E669 Obesity, unspecified: Secondary | ICD-10-CM | POA: Insufficient documentation

## 2012-01-27 DIAGNOSIS — M549 Dorsalgia, unspecified: Secondary | ICD-10-CM | POA: Insufficient documentation

## 2012-01-27 DIAGNOSIS — R569 Unspecified convulsions: Secondary | ICD-10-CM

## 2012-01-27 DIAGNOSIS — R5383 Other fatigue: Secondary | ICD-10-CM | POA: Insufficient documentation

## 2012-01-27 DIAGNOSIS — R5381 Other malaise: Secondary | ICD-10-CM | POA: Insufficient documentation

## 2012-01-27 DIAGNOSIS — K219 Gastro-esophageal reflux disease without esophagitis: Secondary | ICD-10-CM | POA: Insufficient documentation

## 2012-01-27 LAB — COMPREHENSIVE METABOLIC PANEL WITH GFR
ALT: 20 U/L (ref 0–53)
AST: 22 U/L (ref 0–37)
Albumin: 4.5 g/dL (ref 3.5–5.2)
Alkaline Phosphatase: 125 U/L — ABNORMAL HIGH (ref 39–117)
BUN: 12 mg/dL (ref 6–23)
CO2: 25 meq/L (ref 19–32)
Calcium: 10.1 mg/dL (ref 8.4–10.5)
Chloride: 101 meq/L (ref 96–112)
Creatinine, Ser: 0.91 mg/dL (ref 0.50–1.35)
GFR calc Af Amer: >90 mL/min (ref >90)
GFR calc non Af Amer: >90 mL/min (ref >90)
Glucose, Bld: 116 mg/dL — ABNORMAL HIGH (ref 70–99)
Potassium: 4 meq/L (ref 3.5–5.1)
Sodium: 139 meq/L (ref 135–145)
Total Bilirubin: 0.6 mg/dL (ref 0.3–1.2)
Total Protein: 8.2 g/dL (ref 6.0–8.3)

## 2012-01-27 LAB — CBC WITH DIFFERENTIAL/PLATELET
Basophils Absolute: 0 K/uL (ref 0.0–0.1)
Basophils Relative: 0 % (ref 0–1)
Eosinophils Absolute: 0.1 K/uL (ref 0.0–0.7)
Eosinophils Relative: 1 % (ref 0–5)
HCT: 44.7 % (ref 39.0–52.0)
Hemoglobin: 14.7 g/dL (ref 13.0–17.0)
Lymphocytes Relative: 16 % (ref 12–46)
Lymphs Abs: 1.6 K/uL (ref 0.7–4.0)
MCH: 28.6 pg (ref 26.0–34.0)
MCHC: 32.9 g/dL (ref 30.0–36.0)
MCV: 87 fL (ref 78.0–100.0)
Monocytes Absolute: 0.6 K/uL (ref 0.1–1.0)
Monocytes Relative: 6 % (ref 3–12)
Neutro Abs: 7.7 K/uL (ref 1.7–7.7)
Neutrophils Relative %: 77 % (ref 43–77)
Platelets: 204 K/uL (ref 150–400)
RBC: 5.14 MIL/uL (ref 4.22–5.81)
RDW: 13.2 % (ref 11.5–15.5)
WBC: 10 K/uL (ref 4.0–10.5)

## 2012-01-27 LAB — MAGNESIUM: Magnesium: 2.1 mg/dL (ref 1.5–2.5)

## 2012-01-27 LAB — URINALYSIS, ROUTINE W REFLEX MICROSCOPIC
Glucose, UA: NEGATIVE mg/dL
Hgb urine dipstick: NEGATIVE
Ketones, ur: 15 mg/dL — AB
Leukocytes, UA: NEGATIVE
Nitrite: NEGATIVE
Protein, ur: 30 mg/dL — AB
Specific Gravity, Urine: 1.029 (ref 1.005–1.030)
Urobilinogen, UA: 1 mg/dL (ref 0.0–1.0)
pH: 5.5 (ref 5.0–8.0)

## 2012-01-27 LAB — URINE MICROSCOPIC-ADD ON

## 2012-01-27 MED ORDER — LEVETIRACETAM 500 MG PO TABS: 500.0000 mg | ORAL_TABLET | Freq: Two times a day (BID) | ORAL | Status: DC

## 2012-01-27 MED ORDER — HYDROMORPHONE HCL PF 2 MG/ML IJ SOLN
2.0000 mg | Freq: Once | INTRAMUSCULAR | Status: AC
  Administered 2012-01-27: 2 mg via INTRAVENOUS
  Filled 2012-01-27: qty 1

## 2012-01-27 MED ORDER — LEVETIRACETAM 500 MG PO TABS
500.0000 mg | ORAL_TABLET | ORAL | Status: AC
  Administered 2012-01-27: 500 mg via ORAL
  Filled 2012-01-27: qty 1

## 2012-01-27 NOTE — ED Notes (Signed)
Discussed need to follow up with MD

## 2012-01-27 NOTE — ED Notes (Addendum)
Pt reports falling from shower/bath few days ago hitting head on wall. Wife confirms story. Wife reports pt takes Oxycodone HCL 20mg  six times daily and not sure last dose. Wife reports pt shuffling feet and "off balance" for last 3 days.

## 2012-01-27 NOTE — Progress Notes (Signed)
EEG completed as ordered.

## 2012-01-27 NOTE — Procedures (Signed)
History: 57 yo M with transient episode concerning for seizure.   Sedation: None  Background: There is a poorly defined posterior dominant rhythm at 8.5 Hz. There is mild intermixed generalized irregular delta activity. Occasionally, sharp waves are seen in the right parieto-occipital region(P4,P8,O2)  Photic stimulation: Physiologic driving is present  EEG Diagnosis: 1) Right parieto-occipital region sharp waves  Clinical Interpretation: This abnormal EEG is consistent with an area of potential epileptogenicity in the right parieto-occipital region. There was no seizure recorded on this study.   Ritta Slot, MD [facility] [phone]  If 7pm- 7am, please page neurology on call at [phone].

## 2012-01-27 NOTE — ED Notes (Signed)
EMS brought in via EMS for AMS from home. Pt wife reported pt has been confused with unsteady gait more than usual, confusion has increased significantly today per wife. Pt has periods of confusion.

## 2012-01-27 NOTE — Consult Note (Signed)
NEURO HOSPITALIST CONSULT NOTE    Reason for Consult: confusion and left sided weakness  HPI:                                                                                                                                          Mark [name] is an 56 y.o. male who was brought to hospital after wife when he awoke this am confused.    Patient has a history of spinal stenosis with symptoms of back pain. He has never had a symptoms of left leg weakness or sciatic discomfort on left leg.  He takes 20 mg Oxycodone every four hours per wife and dose not miss a dose.   This morning his wife awoke and went to ask her husband a question around 9 AM.  Patient states he feels he was awake at that time.  When she asked the question he continued to repeat "I need to go to work" multiple times and had his fists clenched with bilateral arms flexed. EMS was called by wife. Patient continued to have clenched fists and confusion while with EMS.  He could not tell the date/time or year. His wife states that he returned to baseline over the course of about 3 hours. At present time patient is alert and oriented.   Past Medical History  Diagnosis Date  . CAD (coronary artery disease) 2001    PCI of LAD and RCA, done well since then  . HLD (hyperlipidemia)     intolerant lipitor, crestor  . HTN (hypertension)   . GERD (gastroesophageal reflux disease)     seldom  . Back pain     spinal stenosis and fibromyalgia  . Obesity   . Ischemic cardiomyopathy     EF 50% by stress test 2010  . Colon polyps 2009    rec rpt 1 yr    Past Surgical History  Procedure Date  . Cholecystectomy   . Coronary angioplasty with stent placement 2001    bare metal LAD, RCA  . Cardiovascular stress test 2010    no ischemia,. EF 51%, 7 METS  . Colonoscopy 11/2007    mult polyps, rec rpt 1 yr    Family History  Problem Relation Age of Onset  . Colon cancer Neg Hx   . Stroke Neg Hx   . Diabetes  Neg Hx   . Cancer Father     liver  . Coronary artery disease Father   no history of sz  Social History:  reports that he quit smoking about 11 years ago. He has never used smokeless tobacco. He reports that he does not drink alcohol or use illicit drugs.  Allergies  Allergen Reactions  . Niaspan (Niacin Er) Other (See Comments)    Skin red,  hair falling out    MEDICATIONS:                                                                                                                     No current facility-administered medications for this encounter.   Current Outpatient Prescriptions  Medication Sig Dispense Refill  . baclofen (LIORESAL) 10 MG tablet Take 10 mg by mouth 3 (three) times daily as needed. For muscle spasms      . buPROPion (WELLBUTRIN XL) 150 MG 24 hr tablet Take 1 tablet (150 mg total) by mouth daily.  30 tablet  1  . imipramine (TOFRANIL) 50 MG tablet Take 50 mg by mouth at bedtime.      Marland Kitchen lubiprostone (AMITIZA) 24 MCG capsule Take 24 mcg by mouth 2 (two) times daily with a meal.      . metoprolol (LOPRESSOR) 50 MG tablet Take 75 mg by mouth 2 (two) times daily.       [name] Bicarbonate (ZEGERID) 20-1100 MG CAPS Take 1 capsule by mouth daily as needed. For indigestion      . Oxycodone HCl 20 MG TABS Take 1 tablet by mouth every 4 (four) hours as needed. For pain        ROS:                                                                                                                                       History obtained from the patient  General ROS: negative for - chills, fatigue, fever, night sweats, weight gain or weight loss Psychological ROS: positive for - confusion Ophthalmic ROS: negative for - blurry vision, double vision, eye pain or loss of vision ENT ROS: negative for - epistaxis, nasal discharge, oral lesions, sore throat, tinnitus or vertigo Allergy and Immunology ROS: negative for - hives or itchy/watery eyes Hematological and  Lymphatic ROS: negative for - bleeding problems, bruising or swollen lymph nodes Endocrine ROS: negative for - galactorrhea, hair pattern changes, polydipsia/polyuria or temperature intolerance Respiratory ROS: negative for - cough, hemoptysis, shortness of breath or wheezing Cardiovascular ROS: negative for - chest pain, dyspnea on exertion, edema or irregular heartbeat Gastrointestinal ROS: negative for - abdominal pain, diarrhea, hematemesis, nausea/vomiting or stool incontinence Genito-Urinary ROS: negative for - dysuria, hematuria, incontinence or urinary frequency/urgency Musculoskeletal ROS: positive fr muscular weakness Neurological ROS: as noted in HPI Dermatological ROS: negative for rash and skin  lesion changes   Blood pressure 161/90, pulse 90, temperature 97.5 F (36.4 C), temperature source Oral, resp. rate 20, SpO2 99.00%.   Neurologic Examination:                                                                                                      Mental Status: Alert, oriented, thought content appropriate.  Speech fluent without evidence of aphasia.  Able to follow 3 step commands without difficulty. Cranial Nerves: II: Discs flat bilaterally; Visual fields grossly normal, pupils equal, round, reactive to light and accommodation III,IV, VI: ptosis not present, extra-ocular motions intact bilaterally V,VII: smile symmetric, facial light touch sensation normal bilaterally VIII: hearing normal bilaterally IX,X: gag reflex present XI: bilateral shoulder shrug XII: midline tongue extension Motor: Right : Upper extremity   5/5    Left:     Upper extremity   5/5  Lower extremity   5/5     Lower extremity   5/5 including dorsi/plantarflexion and inversion/eversion --He did show some proximal weakness in the right hip with straight leg extension, though this was due to guarding.  Tone and bulk:normal tone throughout; no atrophy noted Sensory: Pinprick and light touch intact  throughout, bilaterally--initially he stated he had decreased sensation on the right leg and right arm compared to the left.  After testing a second time to confirm, he stated both sides we equal.  Deep Tendon Reflexes: 1+ bilateral UE and no KJ or AJ noted. Symmetric throughout.  Plantars: Mute bilaterally Cerebellar: normal finger-to-nose,  normal heel-to-shin test CV: pulses palpable throughout     Lab Results  Component Value Date/Time   CHOL 235* 01/04/2012  3:07 PM    No results found for this or any previous visit (from the past 48 hour(s)).  No results found.   Assessment/Plan:  56 YO male with 3 week history of right leg weakness and new onset of confusion associated with bilateral arm flexion.  Etiology is unclear at this time but cannot exclude possible seizure activity causing acute confusion. As for his right hip flexion weakness patient has known lumbar spine spondylosis, degenerative disc disease, and facet arthropathy from L3 to S1. It is not clear on exam that he has any weakness at this time, but could follow up as an outpatient with his surgeon.   Recommend: 1) MRI brain to look for seizure focus 2) EEG 3) Outpatient follow up for spinal stenosis.     Felicie Morn PA-C [facility] [phone]  01/27/2012, 2:29 PM     I have seen and evaluated the patient. I have reviewed the above note and made appropriate changes. He does not have any clear weakness to confrontation on exam and sensory exam is inconsistent. At this time, I am concerned that he had a seizure, and he does appear to have some sharp waves on his EEG. Therefore I recommend starting keppra 500mg  BID, and follow up as an outpatient with neurology for seizures, and his surgeon for spinal stenosis.   Ritta Slot, MD [facility] [phone]  If 7pm- 7am, please page neurology  on call at [phone].

## 2012-01-27 NOTE — ED Notes (Signed)
Patient transported to CT 

## 2012-01-27 NOTE — ED Provider Notes (Signed)
History     CSN: [id number]  Arrival date & time 01/27/12  1121   First MD Initiated Contact with Patient 01/27/12 1125      Chief Complaint  Patient presents with  . Altered Mental Status    (Consider location/radiation/quality/duration/timing/severity/associated sxs/prior treatment) HPI Comments: Mark [name] 56 y.o. male   The chief complaint is: Patient presents with:   Altered Mental Status    56 year old man with a past medical history of spinal stenosis who is on chronic pain control since today with his wife chief complaint of altered mental status.  Wife states he has 3 days of new onset shuffling gait.  This morning and he was lying in he can asking if he was supposed to go to work today.  Patient has not worked in the past 10 years.  Wife states he he would not stop asking about going to work.  He seemed upset and confused and had some shaking in the bilateral upper extremities.  She states the he was also talking about things that occurred 10 years ago.  She denies that he chronically abuses alcohol or illicit drugs.  Denies any recent injuries to the head or history of seizures.  She denies that he has had any other complaint. Marland Kitchenlevel 5 caveat   Patient is a 56 y.o. male presenting with altered mental status. The history is provided by the patient and the spouse. No language interpreter was used.  Altered Mental Status This is a new problem. The current episode started in the past 7 days. The problem occurs constantly. The problem has been gradually improving. Pertinent negatives include no anorexia, change in bowel habit, sore throat, swollen glands, urinary symptoms, vertigo or visual change.    Past Medical History  Diagnosis Date  . CAD (coronary artery disease) 2001    PCI of LAD and RCA, done well since then  . HLD (hyperlipidemia)     intolerant lipitor, crestor  . HTN (hypertension)   . GERD (gastroesophageal reflux disease)     seldom  . Back pain      spinal stenosis and fibromyalgia  . Obesity   . Ischemic cardiomyopathy     EF 50% by stress test 2010  . Colon polyps 2009    rec rpt 1 yr    Past Surgical History  Procedure Date  . Cholecystectomy   . Coronary angioplasty with stent placement 2001    bare metal LAD, RCA  . Cardiovascular stress test 2010    no ischemia,. EF 51%, 7 METS  . Colonoscopy 11/2007    mult polyps, rec rpt 1 yr    Family History  Problem Relation Age of Onset  . Colon cancer Neg Hx   . Stroke Neg Hx   . Diabetes Neg Hx   . Cancer Father     liver  . Coronary artery disease Father     History  Substance Use Topics  . Smoking status: Former Smoker    Quit date: 03/02/2000  . Smokeless tobacco: Never Used  . Alcohol Use: No      Review of Systems  Unable to perform ROS: Mental status change  HENT: Negative for sore throat.   Gastrointestinal: Negative for anorexia and change in bowel habit.  Neurological: Negative for vertigo.  Psychiatric/Behavioral: Positive for altered mental status.    Allergies  Niaspan  Home Medications   Current Outpatient Rx  Name  Route  Sig  Dispense  Refill  . BACLOFEN 10  MG PO TABS   Oral   Take 10 mg by mouth 3 (three) times daily as needed. For muscle spasms         . BUPROPION HCL ER (XL) 150 MG PO TB24   Oral   Take 1 tablet (150 mg total) by mouth daily.   30 tablet   1   . IMIPRAMINE HCL 50 MG PO TABS   Oral   Take 50 mg by mouth at bedtime.         . LUBIPROSTONE 24 MCG PO CAPS   Oral   Take 24 mcg by mouth 2 (two) times daily with a meal.         . METOPROLOL TARTRATE 50 MG PO TABS   Oral   Take 75 mg by mouth 2 (two) times daily.          Marland Kitchen OMEPRAZOLE-SODIUM BICARBONATE 20-1100 MG PO CAPS   Oral   Take 1 capsule by mouth daily as needed. For indigestion         . OXYCODONE HCL 20 MG PO TABS   Oral   Take 1 tablet by mouth every 4 (four) hours as needed. For pain           BP 136/86  Pulse 84  Temp 97.5  F (36.4 C) (Oral)  Resp 20  SpO2 100%  Physical Exam  Nursing note and vitals reviewed. Constitutional: He is oriented to person, place, and time. He appears well-developed and well-nourished. No distress.  HENT:  Head: Normocephalic and atraumatic.  Eyes: Conjunctivae normal are normal. No scleral icterus.  Neck: Normal range of motion. Neck supple.  Cardiovascular: Normal rate, regular rhythm and normal heart sounds.   Pulmonary/Chest: Effort normal and breath sounds normal. No respiratory distress.  Abdominal: Soft. There is no tenderness.  Musculoskeletal: He exhibits no edema.  Neurological: He is alert and oriented to person, place, and time.       Speech is clear patient has difficulty following commands especially in regards to using the left arm or leg.  no facial droop CN grossly intact except  Patient forgets to shrug shoulder on the left when asked to test cranial nerve 11. Patient only acknowledges peripheral visual field test on the right.  Patient will use the left and acknowledge it when redirected.  Normal strength in upper and lower extremities bilaterally including dorsiflexion and plantar flexion, strong and equal grip strength Sensation normal to light and sharp touch Moves extremities without ataxia, coordination intact Unable to perform finger to nose accurately due to inablinty to follow commands with left  Finger.  Skin: Skin is warm and dry. He is not diaphoretic.  Psychiatric: His behavior is normal.    ED Course  Procedures (including critical care time)  Labs Reviewed  COMPREHENSIVE METABOLIC PANEL - Abnormal; Notable for the following:    Glucose, Bld 116 (*)     Alkaline Phosphatase 125 (*)     All other components within normal limits  CBC WITH DIFFERENTIAL  ETHANOL  MAGNESIUM  URINALYSIS, ROUTINE W REFLEX MICROSCOPIC   Dg Chest 2 View  01/27/2012  *RADIOLOGY REPORT*  Clinical Data: Altered mental status; hypertension  CHEST - 2 VIEW   Comparison: May 17, 2010  Findings: The lungs are clear.  The heart size and pulmonary vascularity are normal.  No adenopathy.  There is a stent in the left anterior descending coronary artery region.  There is degenerative change in the thoracic spine.  No adenopathy.  IMPRESSION: No edema or consolidation.   Original Report Authenticated By: Bretta Bang, M.D.    Ct Head Wo Contrast  01/27/2012  *RADIOLOGY REPORT*  Clinical Data: Altered mental status.  CT HEAD WITHOUT CONTRAST  Technique:  Contiguous axial images were obtained from the base of the skull through the vertex without contrast.  Comparison: None.  Findings: No mass lesion, mass effect, midline shift, hydrocephalus, hemorrhage.  No acute territorial cortical ischemia/infarct. Atrophy.  Mastoid air cells clear.  Paranasal sinuses are within normal limits.  Prominent sulcus in the right occipital lobe favored over old infarct and encephalomalacia.  IMPRESSION: Atrophy  without acute intracranial abnormality.   Original Report Authenticated By: Andreas Newport, M.D.      No diagnosis found.    MDM   Filed Vitals:   01/27/12 1121 01/27/12 1306 01/27/12 1340 01/27/12 1438  BP: 152/89  161/90 136/86  Pulse: 76  90 84  Temp: 97.5 F (36.4 C) 97.5 F (36.4 C)    TempSrc: Oral     Resp: 20  20 20   SpO2: 100%  99% 100%    Patient seen in shared visit with Dr. Radford Pax.  He appears confused and may haves some  Left side parietal neglect. Patient has been seen and evaluated by Neuro who is following his case.  They recommend MRI after ct without acute findings.    5:14 PM I have given report to PA Pisciotta in CDU who will assume care of the patient.  If negative MRI patient should f/u with PCP.  Arthor Captain, PA-C 01/27/12 1715

## 2012-01-28 NOTE — ED Provider Notes (Signed)
1600 -   Patient care from Laray Anger. Patient here with altered mental status Ann's and confusion earlier. Patient was alert and oriented to time of Ms. Harris his departure. Patient awaiting MRI and EEG at this time.  MRI shown to be negative. EEG without any seizure activity. Patient sitting on his bed and relaxing,  his wife states he is at his baseline. Neurology evaluated patient previously stated that was normal MRI and EEG, he is safe to go home as long as he is at his baseline. Patient given 500 mg of Keppra 3 times a day prescription per neurology Rex. Patient instructed to followup with urology in one to 2 weeks. Discharge home in stable condition.   1. Seizure    F. 2. Altered mental status.  Elwin Mocha, MD 01/28/12 [phone]

## 2012-02-01 NOTE — ED Provider Notes (Signed)
Medical screening examination/treatment/procedure(s) were performed by non-physician practitioner and as supervising physician I was immediately available for consultation/collaboration.    Bailea Beed L Alayla Dethlefs, MD 02/01/12 1914 

## 2012-02-26 ENCOUNTER — Other Ambulatory Visit: Payer: Self-pay | Admitting: Family Medicine

## 2012-02-26 NOTE — Telephone Encounter (Signed)
refil times one please in PCP's absence and I will route to her to designate f/u thanks

## 2012-02-26 NOTE — Telephone Encounter (Signed)
Electronic refill request.  According to last OV note, patient was to have scheduled F/U in 1 month.  No appt scheduled.  It looks like he has went to ER for seizure.  Please advise.

## 2012-02-29 NOTE — Telephone Encounter (Signed)
Please ask pt to make follow up appt.

## 2012-02-29 NOTE — Telephone Encounter (Signed)
Sent one refill,  Routed to Dr. Dayton Martes for f/u designation.

## 2012-03-11 NOTE — Telephone Encounter (Signed)
Advised patient scripts are ready for pick up. 

## 2012-04-05 NOTE — Telephone Encounter (Signed)
Spoke with patient, appt scheduled

## 2012-04-05 NOTE — Telephone Encounter (Signed)
Must be seen for any additional refills.

## 2012-04-18 ENCOUNTER — Telehealth: Payer: Self-pay | Admitting: *Deleted

## 2012-04-18 ENCOUNTER — Encounter: Payer: Self-pay | Admitting: Family Medicine

## 2012-04-18 ENCOUNTER — Ambulatory Visit (INDEPENDENT_AMBULATORY_CARE_PROVIDER_SITE_OTHER): Payer: Medicare Other | Admitting: Family Medicine

## 2012-04-18 VITALS — BP 120/84 | HR 88 | Temp 98.4°F | Wt 222.0 lb

## 2012-04-18 DIAGNOSIS — F329 Major depressive disorder, single episode, unspecified: Secondary | ICD-10-CM

## 2012-04-18 DIAGNOSIS — I10 Essential (primary) hypertension: Secondary | ICD-10-CM

## 2012-04-18 DIAGNOSIS — R569 Unspecified convulsions: Secondary | ICD-10-CM

## 2012-04-18 DIAGNOSIS — F32A Depression, unspecified: Secondary | ICD-10-CM

## 2012-04-18 DIAGNOSIS — F3289 Other specified depressive episodes: Secondary | ICD-10-CM

## 2012-04-18 MED ORDER — LEVETIRACETAM 500 MG PO TABS: 500.0000 mg | ORAL_TABLET | Freq: Two times a day (BID) | ORAL | Status: DC

## 2012-04-18 MED ORDER — METOPROLOL TARTRATE 50 MG PO TABS: 75.0000 mg | ORAL_TABLET | Freq: Two times a day (BID) | ORAL | Status: DC

## 2012-04-18 MED ORDER — BUSPIRONE HCL 15 MG PO TABS: 15.0000 mg | ORAL_TABLET | Freq: Two times a day (BID) | ORAL | Status: DC

## 2012-04-18 NOTE — Telephone Encounter (Signed)
Yes he agreed to restart it on his way out.  I"m sorry it printed.  I will resend it.

## 2012-04-18 NOTE — Patient Instructions (Addendum)
Good to see you. We are starting Buspar 15 mg twice daily for depression. Please call me in a few weeks with an update.  Please stop by to see Shirlee Limerick on your way out to set up your appointment with neurology.

## 2012-04-18 NOTE — Telephone Encounter (Signed)
Dr. Dayton Martes, script for keppra printed out, but according to chart note, and patient, he isn't taking keppra.  Does this need to be called to his pharmacy?

## 2012-04-18 NOTE — Progress Notes (Signed)
Subjective:    Patient ID: Mark [name], male    DOB: February 16, 1956, 57 y.o.   MRN: [id number]  HPI  57 yo male here for follow up.  Previously followed by Dr. Reece Agar, established care with me in 01/2012 and has not followed up.  Depression- past several years, increased tearfulness.   He is on disability due to chronic back pain- followed by pain management.    I started him on Wellbutrin 150 mg XL daily as he denied h/o seizure d/o.  He deferred psychotherapy.  Of note, in reviewing Epic, he was seen in ER shortly after starting the wellbutrin for a witnessed seizure and AMS. According to neurology consult note in Epic, he was confused and continued to repeat "I need to go to work" multiple times and had his fists clenched with bilateral arms flexed.  Patient continued to have clenched fists and confusion while with EMS. His wife states that he returned to baseline over the course of about 3 hours.   EEG in ER was abnormal but wellbutrin was NOT d/c'd and he was started on Keppra.  He ran out of Welbutrin and never started Keppra.  Advised to follow up with me and neurology as outpatient.  He did not not follow up with me or neurology. He has had no further episodes of confusion or seizure like activity.  Still feels very depressed- "does not want to do anything."  He remains tearful. Denies panic attacks.  No SI or HI.  Patient Active Problem List  Diagnosis  . B12 DEFICIENCY  . HYPERTENSION  . CAD (coronary artery disease)  . GERD  . OTHER CONSTIPATION  . Back pain  . Bronchitis  . Unspecified family circumstance  . Depression  . Seizure   Past Medical History  Diagnosis Date  . CAD (coronary artery disease) 2001    PCI of LAD and RCA, done well since then  . HLD (hyperlipidemia)     intolerant lipitor, crestor  . HTN (hypertension)   . GERD (gastroesophageal reflux disease)     seldom  . Back pain     spinal stenosis and fibromyalgia  . Obesity   . Ischemic  cardiomyopathy     EF 50% by stress test 2010  . Colon polyps 2009    rec rpt 1 yr   Past Surgical History  Procedure Laterality Date  . Cholecystectomy    . Coronary angioplasty with stent placement  2001    bare metal LAD, RCA  . Cardiovascular stress test  2010    no ischemia,. EF 51%, 7 METS  . Colonoscopy  11/2007    mult polyps, rec rpt 1 yr   History  Substance Use Topics  . Smoking status: Former Smoker    Quit date: 03/02/2000  . Smokeless tobacco: Never Used  . Alcohol Use: No   Family History  Problem Relation Age of Onset  . Colon cancer Neg Hx   . Stroke Neg Hx   . Diabetes Neg Hx   . Cancer Father     liver  . Coronary artery disease Father    Allergies  Allergen Reactions  . Niaspan (Niacin Er) Other (See Comments)    Skin red, hair falling out   Current Outpatient Prescriptions on File Prior to Visit  Medication Sig Dispense Refill  . baclofen (LIORESAL) 10 MG tablet Take 10 mg by mouth 3 (three) times daily as needed. For muscle spasms      . buPROPion [facility]  XL) 150 MG 24 hr tablet TAKE 1 TABLET (150 MG TOTAL) BY MOUTH DAILY.  30 tablet  0  . imipramine (TOFRANIL) 50 MG tablet Take 50 mg by mouth at bedtime.      . levETIRAcetam (KEPPRA) 500 MG tablet Take 1 tablet (500 mg total) by mouth every 12 (twelve) hours.  60 tablet  0  . lubiprostone (AMITIZA) 24 MCG capsule Take 24 mcg by mouth 2 (two) times daily with a meal.      . metoprolol (LOPRESSOR) 50 MG tablet Take 75 mg by mouth 2 (two) times daily.       [name] Bicarbonate (ZEGERID) 20-1100 MG CAPS Take 1 capsule by mouth daily as needed. For indigestion      . Oxycodone HCl 20 MG TABS Take 1 tablet by mouth every 4 (four) hours as needed. For pain       No current facility-administered medications on file prior to visit.   The PMH, PSH, Social History, Family History, Medications, and allergies have been reviewed in [facility], and have been updated if relevant.    Review of  Systems See HPI    Objective:   Physical Exam BP 120/84  Pulse 88  Temp(Src) 98.4 F (36.9 C)  Wt 222 lb (100.699 kg)  BMI 32.32 kg/m2 General:  overweght male in NAD Eyes:  PERRL Ears:  External ear exam shows no significant lesions or deformities.  Otoscopic examination reveals clear canals, tympanic membranes are intact bilaterally without bulging, retraction, inflammation or discharge. Hearing is grossly normal bilaterally. Nose:  External nasal examination shows no deformity or inflammation. Nasal mucosa are pink and moist without lesions or exudates. Mouth:  Oral mucosa and oropharynx without lesions or exudates.  eduntulous Psych:  Good eye contact, very pleasant and appropriate.     Assessment & Plan:   1. Seizure Advised him to restart Keppra and TO NOT restart Wellbutrin.  I will re refer him to neurology.  The patient indicates understanding of these issues and agrees with the plan.  - Ambulatory referral to Neurology  2. Depression Deteriorated.  He does take Imipramine in evenings for RLS.  I would like to avoid SSRIs. Start buspar and he is aware that wellbutrin can lower seizure threshold and he should never take it. The patient indicates understanding of these issues and agrees with the plan.  3.  HTN  Stable on current dose of lopressor.  Rx refilled.

## 2012-04-25 ENCOUNTER — Encounter: Payer: Self-pay | Admitting: Neurology

## 2012-04-25 ENCOUNTER — Ambulatory Visit (INDEPENDENT_AMBULATORY_CARE_PROVIDER_SITE_OTHER): Payer: Medicare Other | Admitting: Neurology

## 2012-04-25 VITALS — BP 114/70 | HR 100 | Temp 98.1°F | Resp 16 | Ht 71.0 in | Wt 221.0 lb

## 2012-04-25 DIAGNOSIS — G319 Degenerative disease of nervous system, unspecified: Secondary | ICD-10-CM

## 2012-04-25 DIAGNOSIS — G4752 REM sleep behavior disorder: Secondary | ICD-10-CM

## 2012-04-25 MED ORDER — CLONAZEPAM 1 MG PO TABS: 1.0000 mg | ORAL_TABLET | Freq: Two times a day (BID) | ORAL | Status: DC | PRN

## 2012-04-25 NOTE — Progress Notes (Signed)
Dahmir is a 57 year old male with a history of spinal stenosis who is now referred for an episode of altered consciousness that developed after being placed on Wellbutrin.  His wife describes that she came into his room at about 9 in the morning and he was sitting up in bed any Repeating he had to get ready for work. He has been disabled for 6 or 7 years. No matter, she told him he didn't have to go to work, he persisted and perseverated and making a side to side arm movement and sitting he had to go to work. The medics arrived after about 25 minutes of the onset of this episode and she called him 20 minutes into the morning. He was improving by that time and he answered questions but he didn't answer them accurately. For example when he gave his address, he gave his correct address to some place to live several years before. By the time he got to the emergency room he seemed to be answering questions more like normal so that the whole episode was completed within 90 minutes to 2 hours. He did not have incontinence or tongue biting or jerking activity.  EEG did reveal occasional sharp waves on the right side and he was started on Keppra 500 mg twice a day which she can take at first but he has been taking in the last couple of weeks.  She had been having some paresthesias on the left side of his head from where he had fallen out in the shower and hit his head a month prior to the episode. The medicines has relieved paresthesias.  His MRI shows advanced atrophy, particularly of the temporal lobes. His mother is around 74 and she has dementia in the last couple of years. She was adopted and her family history is unknown.  As far as the back problem, he has had back pain going on for decades. It has gradually gotten worse over time.   He used to have claudication symptoms and that he would bend forward or rest it would relieve the claudication symptoms. He now has some cramping in the lower extremities that  sometimes wakes him up in the morning. He tried baclofen and this was not effective.  His last MRI lumbar spine was in 2002 and at that time surgery was discussed but in the end not recommended. At this time, he takes a significant dose of oxycodone but he has to stay in bed most of the day because activities cause a significant increase of the pain even on medication. This has been depressing and that was the reason why the Wellbutrin was prescribed.  He is still wearing about considering an operation, because his old family doctor had an operation and it didn't work out.  In fact his old family doctor had prescribed him clonazepam for her sleeping issue this been going on for many years.  His wife describes that he would wake up at 3 in the morning asking for someone and a wrench as if he was acting out of dream of being at work.  This was a recurring phenomenon, and she estimates that it happened multiple times per week. He probably still does but she does not sleep in the same room at this time.  The clonazepam works great.  When Dr. Georgina Pillion retired for his back problem, his medication was changed and it does not help.  He feels like he is tense in his muscles don't relax at night.  Review  of symptoms is positive for sleep disturbance, acting out his dreams, severe back pain, depression and dry skin. Review of symptoms is otherwise negative.  Past Medical History  Diagnosis Date  . CAD (coronary artery disease) 2001    PCI of LAD and RCA, done well since then  . HLD (hyperlipidemia)     intolerant lipitor, crestor  . HTN (hypertension)   . GERD (gastroesophageal reflux disease)     seldom  . Back pain     spinal stenosis and fibromyalgia  . Obesity   . Ischemic cardiomyopathy     EF 50% by stress test 2010  . Colon polyps 2009    rec rpt 1 yr    Current Outpatient Prescriptions on File Prior to Visit  Medication Sig Dispense Refill  . busPIRone (BUSPAR) 15 MG tablet Take 1 tablet (15 mg  total) by mouth 2 (two) times daily.  60 tablet  3  . imipramine (TOFRANIL) 50 MG tablet Take 50 mg by mouth at bedtime.      . levETIRAcetam (KEPPRA) 500 MG tablet Take 1 tablet (500 mg total) by mouth every 12 (twelve) hours.  60 tablet  0  . lubiprostone (AMITIZA) 24 MCG capsule Take 24 mcg by mouth 2 (two) times daily with a meal.      . metoprolol (LOPRESSOR) 50 MG tablet Take 1.5 tablets (75 mg total) by mouth 2 (two) times daily.  60 tablet  6  . Oxycodone HCl 20 MG TABS Take 1 tablet by mouth every 4 (four) hours as needed. For pain       No current facility-administered medications on file prior to visit.   Niaspan allergy History   Social History  . Marital Status: Married    Spouse Name: N/A    Number of Children: N/A  . Years of Education: N/A   Occupational History  . Not on file.   Social History Main Topics  . Smoking status: Former Smoker    Quit date: 03/02/2000  . Smokeless tobacco: Never Used  . Alcohol Use: No     Comment: never  . Drug Use: No  . Sexually Active: Not on file   Other Topics Concern  . Not on file   Social History Narrative   Caffeine: none   Lives with wife and 2 grandchildren, 1 dog, 1 cat   Occupation: Retired, was Dispensing optician   Activity: no regular activity   Diet: water daily, occasional fruits/vegetables, daily red meat, no fish    Family History  Problem Relation Age of Onset  . Colon cancer Neg Hx   . Stroke Neg Hx   . Diabetes Neg Hx   . Cancer Father     liver  . Coronary artery disease Father    dementia and shuffling gait in mother   BP 114/70  Pulse 100  Temp(Src) 98.1 F (36.7 C)  Resp 16  Ht 5\' 11"  (1.803 m)  Wt 221 lb (100.245 kg)  BMI 30.84 kg/m2   Alert and oriented x 3.  Memory function 2/3 at 5 minutes. Mild trouble with clock drawing task.  Concentration and attention are fair.  Speech is fluent and without significant word finding difficulty.  Is aware of current events.  No  carotid bruits detected.  Cranial nerve II through XII are within normal limits.  This includes normal optic discs and acuity, EOMI, PERLA, facial movement and sensation intact, hearing grossly intact, gag intact,Uvula raises symmetrically and tongue protrudes evenly.  Motor strength is 5 over 5 throughout the uper limbs. 4/5 in distal lower limbs  No atrophy,or tremors.  Tone is slightly increased, no cogwheeling. Reflexes are 2+ and symmetric in the upper and lower extremities except absent at the right ankle Sensory exam reveals decreased vibration and pin in the right foot. Coordination is intact for fine movements and rapid alternating movements in all limbs Gait and station reveals a decided limp.   Impression: 1. Single event of altered consciousness with memory deficits that in some ways could be similar to a transient global amnesia.  This was preceded by a head injury a month previous and there were some possible significant findings on the EEG.  It is uncertain but if I had to guess at this point, I would say it was not a seizure.  However he is tolerating the Keppra well and it has helped the paresthesias that he sustained after the head injury.  2. Is mother has dementia and some Parkinson's sign including a shuffling gait. Unfortunately he shows advanced atrophy and he probably already has some significant memory issues.  I feel that he is probably having down the path of developing a dementia.  In addition, a history of REM behavior disorder symptoms would be a predictor of a Parkinson's-like syndrome.  Possibly Lewy body dementia could explain things including the MRI findings.  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  4.  Severe chronic back pain with a history of spinal stenosis in the past have classic claudication symptoms with no MRI since 2002.  This is also lead to depression and immobility.  Plan: 1. Trial of clonazepam 1.0 mg  for REM behavior disorder which he tolerated very well in the past. 2. Continue Keppra for now as he is getting some benefit for the paresthesias. 3. The previous episode may not have been a seizure and he had been started on Wellbutrin prior to that. He may not require Keppra long-term. 4. Repeat MRI lumbar spine. Given his degree of disability from the back issue, he made want to consider a second opinion for lumbar decompression and fusion to treat the spinal stenosis. He has focal neurological findings in the right leg including an absent right ankle reflex and sensory loss that are probably due to this problem. 5. Return in one month for followup.

## 2012-04-25 NOTE — Patient Instructions (Addendum)
Try clonazepam 1.0 mg at night for sleep  Continue keppra as precribed.  We will schedule follow up MRI of the lumbar spine.  Your MRI is scheduled at [facility] on Friday, Feb. 28th at 3:00 pm.  Please check in at the first floor radiology department 15 minutes prior to your scheduled appointment time. Enter the hospital at the Entrance A off of [address].    [phone].  Return in 4 weeks

## 2012-04-29 ENCOUNTER — Ambulatory Visit (HOSPITAL_COMMUNITY): Payer: Medicare Other

## 2012-05-25 ENCOUNTER — Ambulatory Visit: Payer: Medicare Other | Admitting: Neurology

## 2012-05-31 ENCOUNTER — Other Ambulatory Visit: Payer: Self-pay | Admitting: Family Medicine

## 2012-10-18 ENCOUNTER — Other Ambulatory Visit: Payer: Self-pay | Admitting: Family Medicine

## 2013-02-03 ENCOUNTER — Telehealth: Payer: Self-pay

## 2013-02-03 DIAGNOSIS — M549 Dorsalgia, unspecified: Secondary | ICD-10-CM

## 2013-02-03 NOTE — Telephone Encounter (Signed)
Mark [name] left v/m requesting referral to pain management center; pt has been taking pain med for 20 years and wants to change pain mgt doctors. Mark [name] request [facility]. Please advise.

## 2013-02-05 NOTE — Telephone Encounter (Signed)
Will place referral

## 2013-02-07 NOTE — Telephone Encounter (Signed)
Dr Reece Agar, Please advise what medical records I should send to obtain Pain appt with [facility]. I didn't see any records that we can send and they require them.

## 2013-02-08 NOTE — Telephone Encounter (Signed)
plz ask him to sign release for records from Dr. Op[facility] and come in to discuss with his PCP.

## 2013-02-13 ENCOUNTER — Other Ambulatory Visit: Payer: Self-pay | Admitting: Family Medicine

## 2013-02-13 NOTE — Telephone Encounter (Signed)
Last office visit 04/18/2012.  Ok to refill?

## 2013-04-08 ENCOUNTER — Other Ambulatory Visit: Payer: Self-pay | Admitting: Internal Medicine

## 2013-04-10 NOTE — Telephone Encounter (Signed)
Last OV 04/18/12--Rx last prescribed 01/2013 with 1 refill--please advise

## 2013-04-11 ENCOUNTER — Other Ambulatory Visit: Payer: Self-pay | Admitting: Internal Medicine

## 2013-04-13 ENCOUNTER — Other Ambulatory Visit: Payer: Self-pay | Admitting: Family Medicine

## 2013-05-11 ENCOUNTER — Other Ambulatory Visit: Payer: Self-pay | Admitting: Family Medicine

## 2013-05-15 ENCOUNTER — Ambulatory Visit: Payer: Medicare Other | Admitting: Family Medicine

## 2013-05-18 ENCOUNTER — Other Ambulatory Visit: Payer: Self-pay

## 2013-05-18 MED ORDER — METOPROLOL TARTRATE 50 MG PO TABS: ORAL_TABLET | ORAL | Status: DC

## 2013-05-18 NOTE — Telephone Encounter (Signed)
Mrs Mark [name] left v/m; pt is presently taking metoprolol tarta 50 mg tabs; pt is scheduled for appt 05/22/13;; Mrs Mark [name] could not find out what BP reading was at pain doctors office.Mrs Mark [name] said refill previously denied because pt needs to be seen; Mrs Mark [name] request refill to [facility] until pt seen on 05/22/13. Mrs Mark [name] request cb.

## 2013-05-18 NOTE — Telephone Encounter (Signed)
Mrs Mark [name] said pt saw pain dr the other day and BP was high; does not know BP reading and request refill of atenolol. Do not see atenolol on med list; Mrs Mark [name] will contact pt and cb with correct med.

## 2013-05-22 ENCOUNTER — Ambulatory Visit: Payer: Medicare Other | Admitting: Family Medicine

## 2013-05-22 ENCOUNTER — Ambulatory Visit: Payer: Medicare Other | Admitting: Internal Medicine

## 2013-05-22 DIAGNOSIS — Z0289 Encounter for other administrative examinations: Secondary | ICD-10-CM

## 2013-05-23 ENCOUNTER — Ambulatory Visit
Admission: RE | Admit: 2013-05-23 | Discharge: 2013-05-23 | Disposition: A | Discharge status: Home / Self Care | Payer: Medicare Other | Source: Ambulatory Visit | Attending: Osteopathic Medicine | Admitting: Osteopathic Medicine

## 2013-05-23 ENCOUNTER — Other Ambulatory Visit: Payer: Self-pay | Admitting: Osteopathic Medicine

## 2013-05-23 DIAGNOSIS — IMO0002 Reserved for concepts with insufficient information to code with codable children: Secondary | ICD-10-CM

## 2013-05-25 ENCOUNTER — Emergency Department (HOSPITAL_COMMUNITY): Payer: Medicare Other

## 2013-05-25 ENCOUNTER — Encounter (HOSPITAL_COMMUNITY): Payer: Self-pay | Admitting: Emergency Medicine

## 2013-05-25 ENCOUNTER — Emergency Department (HOSPITAL_COMMUNITY)
Admission: EM | Admit: 2013-05-25 | Discharge: 2013-05-25 | Disposition: A | Discharge status: Home / Self Care | Payer: Medicare Other | Attending: Emergency Medicine | Admitting: Emergency Medicine

## 2013-05-25 DIAGNOSIS — S0990XA Unspecified injury of head, initial encounter: Secondary | ICD-10-CM | POA: Insufficient documentation

## 2013-05-25 DIAGNOSIS — G8929 Other chronic pain: Secondary | ICD-10-CM | POA: Insufficient documentation

## 2013-05-25 DIAGNOSIS — Y9389 Activity, other specified: Secondary | ICD-10-CM | POA: Insufficient documentation

## 2013-05-25 DIAGNOSIS — Z860100 Personal history of colon polyps, unspecified: Secondary | ICD-10-CM | POA: Insufficient documentation

## 2013-05-25 DIAGNOSIS — Z8719 Personal history of other diseases of the digestive system: Secondary | ICD-10-CM | POA: Insufficient documentation

## 2013-05-25 DIAGNOSIS — Z79899 Other long term (current) drug therapy: Secondary | ICD-10-CM | POA: Insufficient documentation

## 2013-05-25 DIAGNOSIS — I2510 Atherosclerotic heart disease of native coronary artery without angina pectoris: Secondary | ICD-10-CM | POA: Insufficient documentation

## 2013-05-25 DIAGNOSIS — R079 Chest pain, unspecified: Secondary | ICD-10-CM | POA: Insufficient documentation

## 2013-05-25 DIAGNOSIS — I10 Essential (primary) hypertension: Secondary | ICD-10-CM | POA: Insufficient documentation

## 2013-05-25 DIAGNOSIS — W1809XA Striking against other object with subsequent fall, initial encounter: Secondary | ICD-10-CM | POA: Insufficient documentation

## 2013-05-25 DIAGNOSIS — Y9210 Unspecified residential institution as the place of occurrence of the external cause: Secondary | ICD-10-CM | POA: Insufficient documentation

## 2013-05-25 DIAGNOSIS — W19XXXA Unspecified fall, initial encounter: Secondary | ICD-10-CM

## 2013-05-25 DIAGNOSIS — E669 Obesity, unspecified: Secondary | ICD-10-CM | POA: Insufficient documentation

## 2013-05-25 DIAGNOSIS — IMO0002 Reserved for concepts with insufficient information to code with codable children: Secondary | ICD-10-CM | POA: Insufficient documentation

## 2013-05-25 DIAGNOSIS — R0989 Other specified symptoms and signs involving the circulatory and respiratory systems: Secondary | ICD-10-CM | POA: Insufficient documentation

## 2013-05-25 DIAGNOSIS — J3489 Other specified disorders of nose and nasal sinuses: Secondary | ICD-10-CM | POA: Insufficient documentation

## 2013-05-25 DIAGNOSIS — Z87891 Personal history of nicotine dependence: Secondary | ICD-10-CM | POA: Insufficient documentation

## 2013-05-25 DIAGNOSIS — R55 Syncope and collapse: Secondary | ICD-10-CM | POA: Insufficient documentation

## 2013-05-25 DIAGNOSIS — Z9861 Coronary angioplasty status: Secondary | ICD-10-CM | POA: Insufficient documentation

## 2013-05-25 DIAGNOSIS — R42 Dizziness and giddiness: Secondary | ICD-10-CM | POA: Insufficient documentation

## 2013-05-25 LAB — I-STAT TROPONIN, ED: TROPONIN I, POC: 0 ng/mL (ref 0.00–0.08)

## 2013-05-25 LAB — CBC
HCT: 39.4 % (ref 39.0–52.0)
Hemoglobin: 13.2 g/dL (ref 13.0–17.0)
MCH: 29.9 pg (ref 26.0–34.0)
MCHC: 33.5 g/dL (ref 30.0–36.0)
MCV: 89.3 fL (ref 78.0–100.0)
Platelets: 323 10*3/uL (ref 150–400)
RBC: 4.41 MIL/uL (ref 4.22–5.81)
RDW: 13.8 % (ref 11.5–15.5)
WBC: 8.1 10*3/uL (ref 4.0–10.5)

## 2013-05-25 LAB — URINALYSIS, ROUTINE W REFLEX MICROSCOPIC
GLUCOSE, UA: NEGATIVE mg/dL
HGB URINE DIPSTICK: NEGATIVE
Ketones, ur: 15 mg/dL — AB
Leukocytes, UA: NEGATIVE
Nitrite: NEGATIVE
PROTEIN: 30 mg/dL — AB
Specific Gravity, Urine: 1.031 — ABNORMAL HIGH (ref 1.005–1.030)
UROBILINOGEN UA: 1 mg/dL (ref 0.0–1.0)
pH: 5 (ref 5.0–8.0)

## 2013-05-25 LAB — BASIC METABOLIC PANEL
BUN: 15 mg/dL (ref 6–23)
CALCIUM: 8.8 mg/dL (ref 8.4–10.5)
CO2: 25 mEq/L (ref 19–32)
CREATININE: 1.17 mg/dL (ref 0.50–1.35)
Chloride: 101 mEq/L (ref 96–112)
GFR calc non Af Amer: 66 mL/min — ABNORMAL LOW (ref >90)
GFR, EST AFRICAN AMERICAN: 77 mL/min — AB (ref >90)
Glucose, Bld: 131 mg/dL — ABNORMAL HIGH (ref 70–99)
Potassium: 4.1 mEq/L (ref 3.7–5.3)
Sodium: 139 mEq/L (ref 137–147)

## 2013-05-25 LAB — URINE MICROSCOPIC-ADD ON

## 2013-05-25 MED ORDER — FENTANYL CITRATE 0.05 MG/ML IJ SOLN
50.0000 ug | Freq: Once | INTRAMUSCULAR | Status: AC
  Administered 2013-05-25: 50 ug via INTRAVENOUS
  Filled 2013-05-25: qty 2

## 2013-05-25 MED ORDER — SODIUM CHLORIDE 0.9 % IV BOLUS (SEPSIS)
1000.0000 mL | Freq: Once | INTRAVENOUS | Status: AC
  Administered 2013-05-25: 1000 mL via INTRAVENOUS

## 2013-05-25 NOTE — Discharge Instructions (Signed)
Near-Syncope °Near-syncope (commonly known as near fainting) is sudden weakness, dizziness, or feeling like you might pass out. During an episode of near-syncope, you may also develop pale skin, have tunnel vision, or feel sick to your stomach (nauseous). Near-syncope may occur when getting up after sitting or while standing for a long time. It is caused by a sudden decrease in blood flow to the brain. This decrease can result from various causes or triggers, most of which are not serious. However, because near-syncope can sometimes be a sign of something serious, a medical evaluation is required. The specific cause is often not determined. °HOME CARE INSTRUCTIONS  °Monitor your condition for any changes. The following actions may help to alleviate any discomfort you are experiencing: °· Have someone stay with you until you feel stable. °· Lie down right away if you start feeling like you might faint. Breathe deeply and steadily. Wait until all the symptoms have passed. Most of these episodes last only a few minutes. You may feel tired for several hours.   °· Drink enough fluids to keep your urine clear or pale yellow.   °· If you are taking blood pressure or heart medicine, get up slowly when seated or lying down. Take several minutes to sit and then stand. This can reduce dizziness. °· Follow up with your health care provider as directed.  °SEEK IMMEDIATE MEDICAL CARE IF:  °· You have a severe headache.   °· You have unusual pain in the chest, abdomen, or back.   °· You are bleeding from the mouth or rectum, or you have black or tarry stool.   °· You have an irregular or very fast heartbeat.   °· You have repeated fainting or have seizure-like jerking during an episode.   °· You faint when sitting or lying down.   °· You have confusion.   °· You have difficulty walking.   °· You have severe weakness.   °· You have vision problems.   °MAKE SURE YOU:  °· Understand these instructions. °· Will watch your  condition. °· Will get help right away if you are not doing well or get worse. °Document Released: 02/16/2005 Document Revised: 10/19/2012 Document Reviewed: 07/22/2012 °[employer]® Patient Information ©2014 [employer]. ° °

## 2013-05-25 NOTE — ED Provider Notes (Signed)
CSN: [id number]     Arrival date & time 05/25/13  0920 History   First MD Initiated Contact with Patient 05/25/13 [phone]     Chief Complaint  Patient presents with  . Fall     (Consider location/radiation/quality/duration/timing/severity/associated sxs/prior Treatment) HPI 58 year old obese male history of CAD, HDL, HTN, seizure, chronic back pain who was sent here from his doctor's office for evaluation of near syncope. Patient states for the past week he has been feeling weak with URI symptoms including nasal congestion, sneezing, chest congestion and ribs pain. He also has history of chronic back pain which he followup with his pain management doctor today. While getting up from a sitting position in the waiting room, he experiencing lightheadedness and dizziness and subsequently fell forward hitting his head against the wall and fell backward hitting his bottom against the ground. It took several people to help him up. He complaining of pain to his buttock and pain to his forehead. He was sent here for further evaluation. Initially he reports he may have taken more pain medication than usual, however he currently denies. Does endorse chills but denies fever, productive cough, hemoptysis, exertional chest pain, back pain or abdominal pain, nausea vomiting diarrhea, or rash. Denies any new numbness, or focal weakness.  Past Medical History  Diagnosis Date  . CAD (coronary artery disease) 2001    PCI of LAD and RCA, done well since then  . HLD (hyperlipidemia)     intolerant lipitor, crestor  . HTN (hypertension)   . GERD (gastroesophageal reflux disease)     seldom  . Back pain     spinal stenosis and fibromyalgia  . Obesity   . Ischemic cardiomyopathy     EF 50% by stress test 2010  . Colon polyps 2009    rec rpt 1 yr   Past Surgical History  Procedure Laterality Date  . Cholecystectomy    . Coronary angioplasty with stent placement  2001    bare metal LAD, RCA  . Cardiovascular  stress test  2010    no ischemia,. EF 51%, 7 METS  . Colonoscopy  11/2007    mult polyps, rec rpt 1 yr   Family History  Problem Relation Age of Onset  . Colon cancer Neg Hx   . Stroke Neg Hx   . Diabetes Neg Hx   . Cancer Father     liver  . Coronary artery disease Father    History  Substance Use Topics  . Smoking status: Former Smoker    Quit date: 03/02/2000  . Smokeless tobacco: Never Used  . Alcohol Use: No     Comment: never    Review of Systems  All other systems reviewed and are negative.      Allergies  Niaspan  Home Medications   Current Outpatient Rx  Name  Route  Sig  Dispense  Refill  . AMITIZA 24 MCG capsule      TAKE 1 CAPSULE BY MOUTH WITH FOOD TWICE A DAY   60 capsule   5   . busPIRone (BUSPAR) 15 MG tablet   Oral   Take 1 tablet (15 mg total) by mouth 2 (two) times daily.   60 tablet   3   . clonazePAM (KLONOPIN) 1 MG tablet   Oral   Take 1 tablet (1 mg total) by mouth 2 (two) times daily as needed for anxiety.   30 tablet   5   . imipramine (TOFRANIL) 50 MG tablet  TAKE 2 TABLET BY MOUTH EVERY EVENING   60 tablet   1   . levETIRAcetam (KEPPRA) 500 MG tablet   Oral   Take 1 tablet (500 mg total) by mouth every 12 (twelve) hours.   60 tablet   0   . metoprolol (LOPRESSOR) 50 MG tablet      TAKE 1.5 TABLETS (75 MG TOTAL) BY MOUTH 2 (TWO) TIMES DAILY.   60 tablet   0     Office visit required for additional refills   . Oxycodone HCl 20 MG TABS   Oral   Take 1 tablet by mouth every 4 (four) hours as needed. For pain          BP 94/55  Pulse 70  Temp(Src) 98.5 F (36.9 C) (Oral)  Resp 18  Ht 5\' 9"  (1.753 m)  Wt 225 lb (102.059 kg)  BMI 33.21 kg/m2  SpO2 98% Physical Exam  Constitutional: He appears well-developed and well-nourished. No distress.  HENT:  Head: Atraumatic.  Right Ear: External ear normal.  Left Ear: External ear normal.  Mouth/Throat: Oropharynx is clear and moist.  Mild tenderness to  forehead without any crepitus or deformity  Eyes: Conjunctivae are normal.  Neck: Normal range of motion. Neck supple. No JVD present.  No cervical midline tenderness, crepitus or step off  Cardiovascular: Normal rate and regular rhythm.   Pulmonary/Chest: He has rales (Faint rales on the right lower lung base. No wheezing, no rhonchi).  Abdominal: There is no tenderness.  Musculoskeletal: He exhibits tenderness (Mild tenderness to the sacral region, and tenderness in bilateral diffuse without any crepitus. No obvious deformity. Decrease bilateral hip flexion and extension internal and external rotation.).  Neurological: He is alert.  Skin: No rash noted.  Psychiatric: He has a normal mood and affect.    ED Course  Procedures (including critical care time)  10:07 AM Pt with near syncope. Has low BP, but not tachycardic.  Does not meet SIRS.  Has viral sxs, cxr ordered to r/o pna.  ivf given.  Will xray chest and pelvic region.  Low suspicion for fx.    12:16 PM Normal orthostatic vital sign, able to ambulate without difficulty. X-ray of bilateral hip and pelvis, with sacrum appears to be unremarkable. Chest x-ray without any obvious displaced rib fracture.  12:53 PM UA without any signs of urinary tract infection. Patient is able to ambulate. Workup has been unremarkable. Patient states he feels much better. At this time the patient is stable for discharge. Discuss with Dr. Adriana Simas. Patient thinks he may have accidentally took his blood pressure medication twice this morning. He has normal orthostatic vital signs and vital signs were stable at this time. EKG without any arrhythmia.  Labs Review Labs Reviewed  BASIC METABOLIC PANEL - Abnormal; Notable for the following:    Glucose, Bld 131 (*)    GFR calc non Af Amer 66 (*)    GFR calc Af Amer 77 (*)    All other components within normal limits  URINALYSIS, ROUTINE W REFLEX MICROSCOPIC - Abnormal; Notable for the following:    Color,  Urine AMBER (*)    Specific Gravity, Urine 1.031 (*)    Bilirubin Urine SMALL (*)    Ketones, ur 15 (*)    Protein, ur 30 (*)    All other components within normal limits  URINE MICROSCOPIC-ADD ON - Abnormal; Notable for the following:    Casts HYALINE CASTS (*)    All other components within  normal limits  CBC  I-STAT TROPOININ, ED   Imaging Review Dg Lumbar Spine Complete  05/23/2013   CLINICAL DATA:  Diffuse low back pain, no trauma  EXAM: LUMBAR SPINE - COMPLETE 4+ VIEW  COMPARISON:  Lumbar spine films of 01/11/2006  FINDINGS: There is no change in alignment of the lumbar vertebrae. There is diffuse ankylosis of the entire lumbar spine with fusion throughout the facet joints as well. The SI joints also are indistinct and fused and this may represent changes of ankylosing spondylitis. No compression deformity is seen.  IMPRESSION: Anklylosis of the entire lumbar spine with fusion of all of the facet joints. Normal alignment. No acute abnormality. Question ankylosing spondylitis?   Electronically Signed   By: Dwyane Dee M.D.   On: 05/23/2013 16:10     EKG Interpretation None      Date: 05/25/2013  Rate: 70  Rhythm: normal sinus rhythm  QRS Axis: normal  Intervals: normal  ST/T Wave abnormalities: normal  Conduction Disutrbances: none  Narrative Interpretation:   Old EKG Reviewed: No significant changes noted     MDM   Final diagnoses:  Near syncope  Fall    BP 112/67  Pulse 74  Temp(Src) 98.5 F (36.9 C) (Oral)  Resp 14  Ht 5\' 9"  (1.753 m)  Wt 225 lb (102.059 kg)  BMI 33.21 kg/m2  SpO2 96%  I have reviewed nursing notes and vital signs. I personally reviewed the imaging tests through PACS system  I reviewed available ER/hospitalization records thought the EMR     Fayrene Helper, [place] 05/25/13 1256

## 2013-05-25 NOTE — ED Notes (Addendum)
Pt was at doctor for a check up and he got dizzy standing from chair and fell forward into wall hitting his head. He denies loc. He continues to feel dizzy. He c/o tailbone pain but states he did not injure his tailbone. Doctors office checked and found his bp was 97/61 at the time of the incident. He thinks he may have taken an extra one of his tramadol pills this morning accidently

## 2013-05-28 NOTE — ED Provider Notes (Signed)
Medical screening examination/treatment/procedure(s) were conducted as a shared visit with non-physician practitioner(s) and myself.  I personally evaluated the patient during the encounter.   EKG Interpretation   Date/Time:  Thursday May 25 2013 [id number]:10:[id number] EDT Ventricular Rate:  70 PR Interval:  165 QRS Duration: 92 QT Interval:  388 QTC Calculation: 419 R Axis:   47 Text Interpretation:  Sinus rhythm Baseline wander in lead(s) I III aVL V3  ED PHYSICIAN INTERPRETATION AVAILABLE IN [facility] Confirmed by  TEST, Record ([id number]) on 05/27/2013 1:43:30 PM     Patient has normal exam. Blood pressure is normalized at discharge. No neurological deficits. Hemoglobin stable. EKG shows normal sinus rhythm  Donnetta HutchingBrian Miosha Behe, MD 05/28/13 1515

## 2013-06-06 ENCOUNTER — Encounter: Payer: Self-pay | Admitting: Cardiology

## 2013-06-06 ENCOUNTER — Ambulatory Visit (INDEPENDENT_AMBULATORY_CARE_PROVIDER_SITE_OTHER): Payer: Medicare Other | Admitting: Cardiology

## 2013-06-06 VITALS — BP 138/92 | HR 94 | Ht 69.5 in | Wt 243.0 lb

## 2013-06-06 DIAGNOSIS — I2510 Atherosclerotic heart disease of native coronary artery without angina pectoris: Secondary | ICD-10-CM

## 2013-06-06 DIAGNOSIS — R55 Syncope and collapse: Secondary | ICD-10-CM | POA: Insufficient documentation

## 2013-06-06 DIAGNOSIS — I10 Essential (primary) hypertension: Secondary | ICD-10-CM

## 2013-06-06 DIAGNOSIS — E785 Hyperlipidemia, unspecified: Secondary | ICD-10-CM

## 2013-06-06 MED ORDER — METOPROLOL TARTRATE 50 MG PO TABS: 100.0000 mg | ORAL_TABLET | Freq: Every morning | ORAL | Status: AC

## 2013-06-06 MED ORDER — ASPIRIN EC 81 MG PO TBEC: 81.0000 mg | DELAYED_RELEASE_TABLET | Freq: Every day | ORAL | Status: AC

## 2013-06-06 NOTE — Patient Instructions (Addendum)
Your physician has recommended you make the following change in your medication: 1. Start Aspirin 81 MG 1 tablet daily  Your physician recommends that you return for lab work at your convenience for Fasting LIPID and ALT. Please schedule with Check out before leaving today.  Your physician wants you to follow-up in: 1 year with Dr Sherlyn Lickurner You will receive a reminder letter in the mail two months in advance. If you don't receive a letter, please call our office to schedule the follow-up appointment.

## 2013-06-06 NOTE — Progress Notes (Signed)
[address] [place], [place]  [address] Phone: [phone] Fax:  [phone]  Date:  06/06/2013   ID:  Mark [name], DOB 1955-04-15, MRN [id number]  PCP:  Ruthe Mannan, MD  Cardiologist:  Armanda Magic, MD     History of Present Illness: Mark [name] is a 58 y.o. male with a history of ASCAD, HTN and dyslipidemia who presents today for cardiac evaluation since he has not been seen in 5 years.  His last nuclear stress test was in 2012 and showed no ischemia. He was recently seen at [facility] ER for URI symptoms.  He went to see his pain MD and got dizziness and fell down to the floor hitting his head but he never passed out.  Apparently he accidentally took his BP meds twice that am and his BP was low.  He denies any anginal chest pain or pressure.  He denies any SOB although his wife says that he is very sedentary due to chronic back pain.  He denies any palpitations or LE edema.    Wt Readings from Last 3 Encounters:  06/06/13 243 lb (110.224 kg)  05/25/13 225 lb (102.059 kg)  04/25/12 221 lb (100.245 kg)     Past Medical History  Diagnosis Date  . HTN (hypertension)   . GERD (gastroesophageal reflux disease)     seldom  . Back pain     spinal stenosis and fibromyalgia  . Obesity   . Ischemic cardiomyopathy     EF 50% by stress test 2010  . Colon polyps 2009    rec rpt 1 yr  . CAD (coronary artery disease) 2001    PCI of LAD and RCA, done well since then  . Syncope and collapse     remote with no reoccurence  . HLD (hyperlipidemia)     intolerant lipitor, crestor    Current Outpatient Prescriptions  Medication Sig Dispense Refill  . gabapentin (NEURONTIN) 300 MG capsule Take 600-1,200 mg by mouth 2 (two) times daily. 600mg  at dinner and 1200mg  at bedtime      . imipramine (TOFRANIL) 50 MG tablet Take 100 mg by mouth at bedtime.      Marland Kitchen lubiprostone (AMITIZA) 24 MCG capsule Take 24 mcg by mouth 2 (two) times daily with a meal.      . metoprolol (LOPRESSOR) 50  MG tablet Take 100 mg by mouth every morning.      Marland Kitchen tiZANidine (ZANAFLEX) 4 MG tablet Take 4 mg by mouth every 4 (four) hours.       . traMADol (ULTRAM) 50 MG tablet Take 50 mg by mouth every 6 (six) hours as needed for moderate pain.        No current facility-administered medications for this visit.    Allergies:    Allergies  Allergen Reactions  . Niaspan [Niacin Er] Other (See Comments)    Skin red, hair falling out    Social History:  The patient  reports that he quit smoking about 13 years ago. He has never used smokeless tobacco. He reports that he does not drink alcohol or use illicit drugs.   Family History:  The patient's family history includes Cancer in his father; Coronary artery disease in his father. There is no history of Colon cancer, Stroke, or Diabetes.   ROS:  Please see the history of present illness.      All other systems reviewed and negative.   PHYSICAL EXAM: VS:  BP 138/92  Pulse  94  Ht 5' 9.5" (1.765 m)  Wt 243 lb (110.224 kg)  BMI 35.38 kg/m2 Well nourished, well developed, in no acute distress HEENT: normal Neck: no JVD Cardiac:  normal S1, S2; RRR; no murmur Lungs:  clear to auscultation bilaterally, no wheezing, rhonchi or rales Abd: soft, nontender, no hepatomegaly Ext: no edema Skin: warm and dry Neuro:  CNs 2-12 intact, no focal abnormalities noted  EKG:     NSR at 94bpm with nonspecific T wave abnormality  ASSESSMENT AND PLAN:  1.   ASCAD with no angina - start ASA 81mg  daily 2.   HTN - BP slightly elevated but he has not taken his am BP med - continue Metoprolol 3.   Dyslipidemia - fasting lipid panel and ALT  Followup with me in 1 year  Signed, Armanda Magicraci Turner, MD 06/06/2013 9:51 AM

## 2013-06-20 ENCOUNTER — Other Ambulatory Visit: Payer: Medicare Other

## 2013-06-30 DEATH — deceased

## 2013-07-04 ENCOUNTER — Telehealth: Payer: Self-pay | Admitting: Cardiology

## 2013-07-04 NOTE — Telephone Encounter (Signed)
07/04/2013 Medical Examiner Mark Presserim McNeil, MD called to request death certificate completion by Mark Magicraci Turner, Md. Originally caller indicated Mark BracketWilliam Hopper,MD  would sign-reviewed chart [name] never seen patient before. After researching chart patient was self referral to [name], ER visit for falls, no shows and visits greater than one year for Mark [name] and Mark HopperMichael Soo, MD. Called Dr. Uvaldo RisingMcNeil back, [facility].  rmf

## 2015-11-27 IMAGING — CR DG CHEST 2V
2 series · 2 of 2 positions shown · non-contrast
Comparison: DG CHEST 2 VIEW dated 01/27/2012

CLINICAL DATA: Fall

EXAM:
CHEST  2 VIEW

[w chest pa]
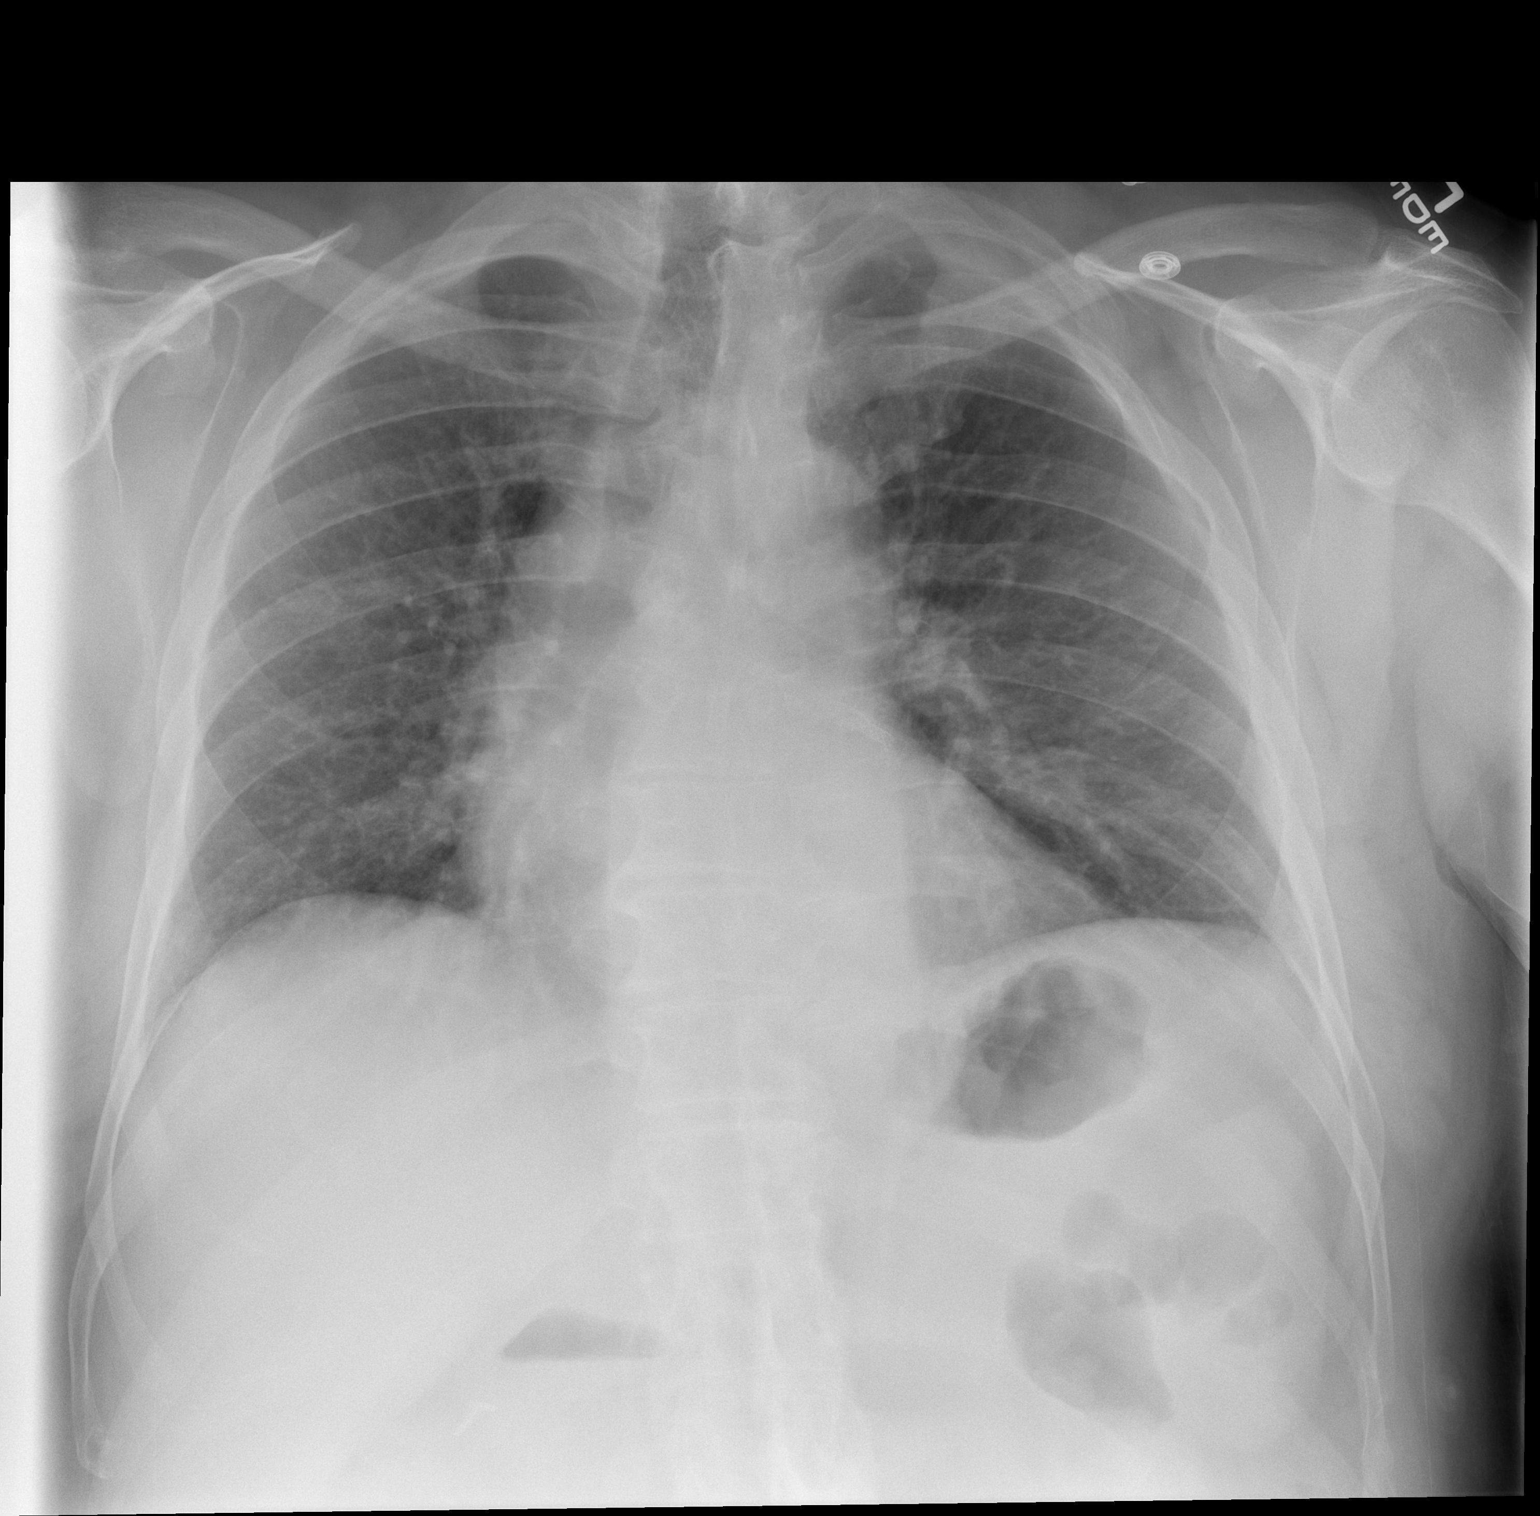

[w chest lat]
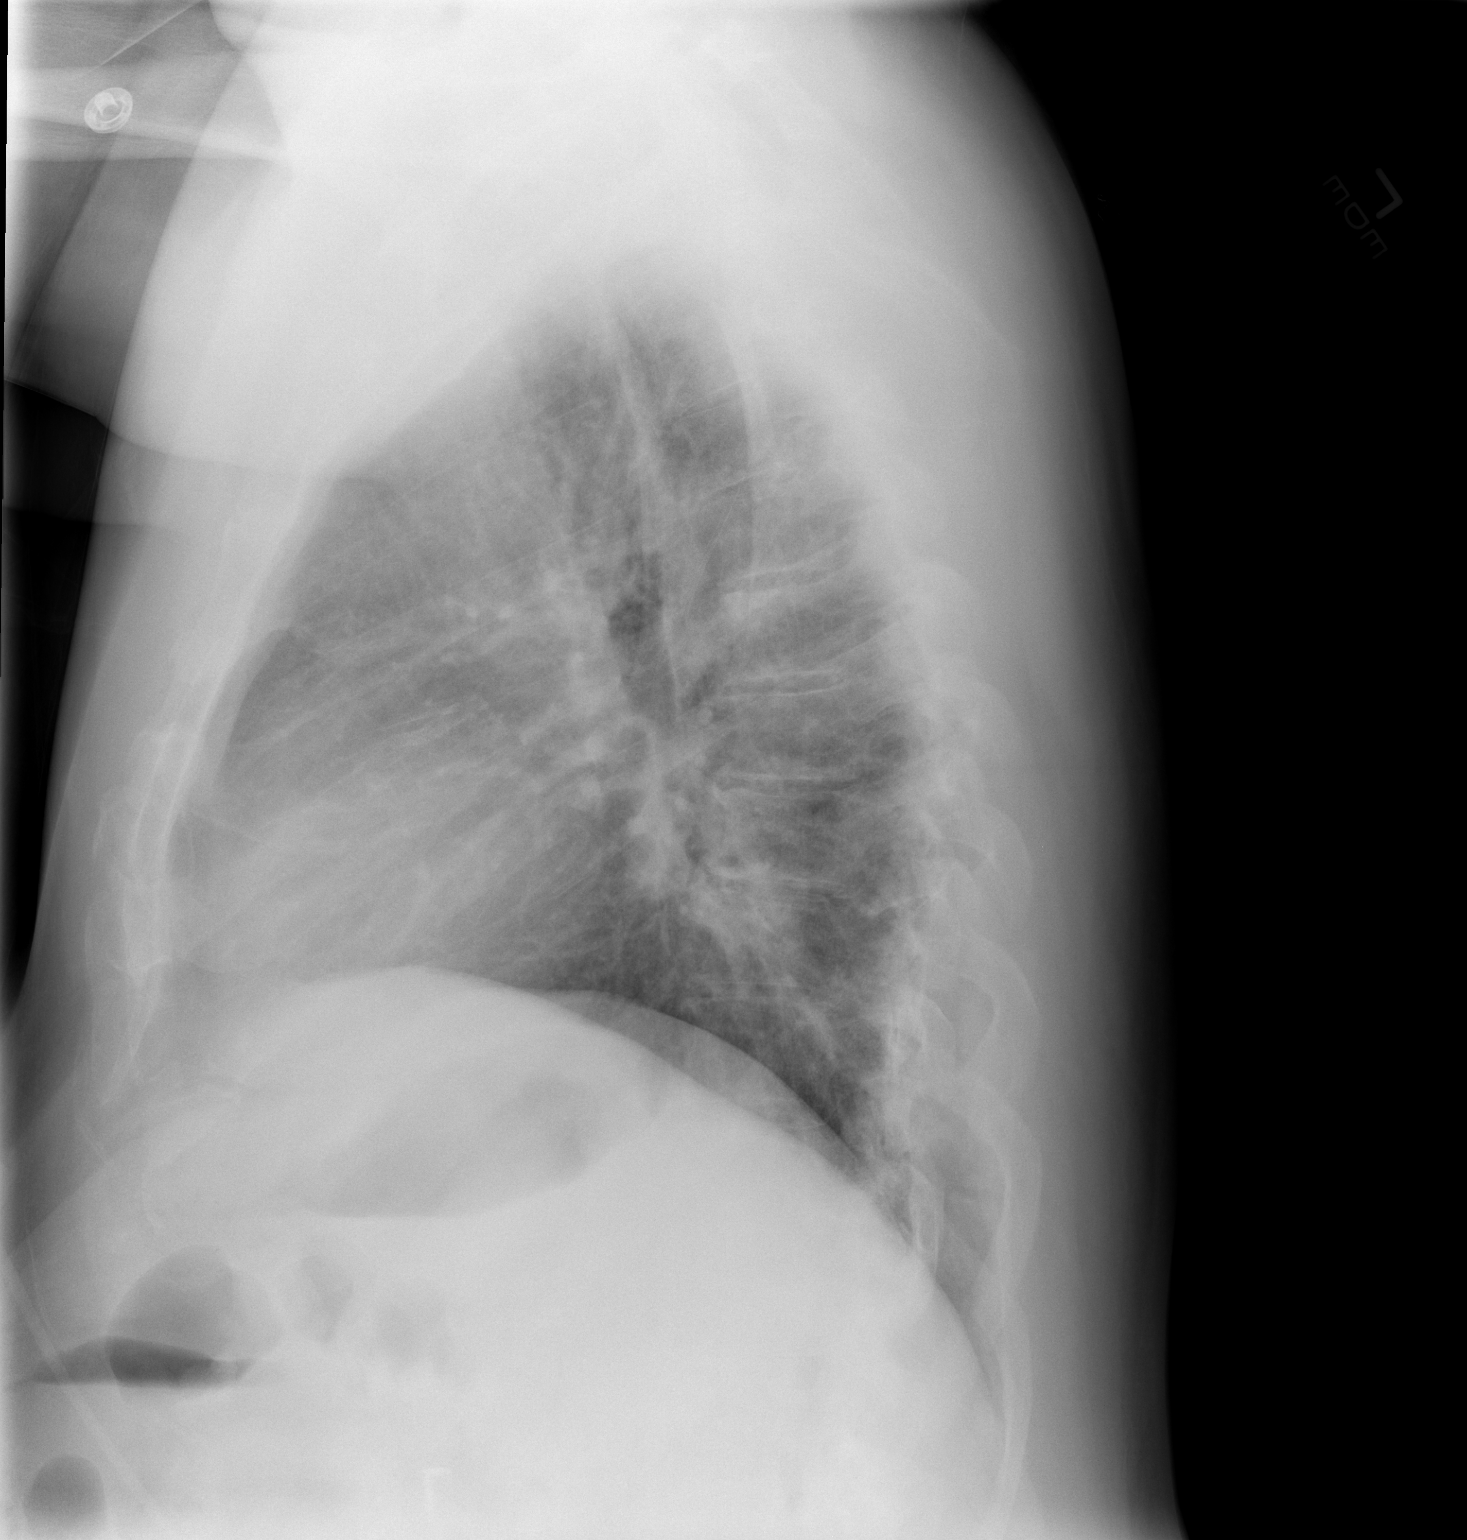

[2 of 2 positions shown; findings below may reference images not displayed]

FINDINGS: The heart size and mediastinal contours are within normal limits.
Both lungs are clear. There is mild irregularity of the third left
posterior lateral rib which may be secondary to overlapping
structures versus injury.
IMPRESSION: No active cardiopulmonary disease.

There is mild irregularity of the third left posterior lateral rib
which may be secondary to overlapping structures versus injury. If
there is clinical concern regarding rib injury, dedicated rib series
is recommended.
